# Patient Record
Sex: Male | Born: 1970
Health system: Southern US, Community
[De-identification: ages and names within clinical notes are randomized; demographics above are authoritative.]

## PROBLEM LIST (undated history)

## (undated) DIAGNOSIS — E785 Hyperlipidemia, unspecified: Secondary | ICD-10-CM

## (undated) DIAGNOSIS — I10 Essential (primary) hypertension: Secondary | ICD-10-CM

## (undated) HISTORY — DX: Hyperlipidemia, unspecified: E78.5

## (undated) HISTORY — DX: Essential (primary) hypertension: I10

---

## 2006-11-16 ENCOUNTER — Emergency Department: Payer: Self-pay | Admitting: Emergency Medicine

## 2006-11-29 ENCOUNTER — Ambulatory Visit: Payer: Self-pay

## 2007-02-09 ENCOUNTER — Ambulatory Visit: Payer: Self-pay | Admitting: Pain Medicine

## 2007-02-19 ENCOUNTER — Ambulatory Visit: Payer: Self-pay | Admitting: Pain Medicine

## 2007-02-21 ENCOUNTER — Ambulatory Visit: Payer: Self-pay | Admitting: Pain Medicine

## 2007-04-15 ENCOUNTER — Ambulatory Visit: Payer: Self-pay | Admitting: Pain Medicine

## 2007-05-07 ENCOUNTER — Ambulatory Visit: Payer: Self-pay | Admitting: Pain Medicine

## 2007-06-05 ENCOUNTER — Ambulatory Visit: Payer: Self-pay | Admitting: Physician Assistant

## 2007-06-25 ENCOUNTER — Ambulatory Visit: Payer: Self-pay | Admitting: Pain Medicine

## 2007-07-05 ENCOUNTER — Ambulatory Visit: Payer: Self-pay | Admitting: Emergency Medicine

## 2007-07-17 ENCOUNTER — Ambulatory Visit: Payer: Self-pay | Admitting: Physician Assistant

## 2007-07-28 ENCOUNTER — Encounter: Payer: Self-pay | Admitting: Emergency Medicine

## 2007-08-01 ENCOUNTER — Encounter: Payer: Self-pay | Admitting: Emergency Medicine

## 2007-09-01 ENCOUNTER — Ambulatory Visit: Payer: Self-pay | Admitting: Physician Assistant

## 2007-10-30 ENCOUNTER — Ambulatory Visit: Payer: Self-pay | Admitting: Physician Assistant

## 2007-12-13 ENCOUNTER — Ambulatory Visit: Payer: Self-pay | Admitting: Family Medicine

## 2007-12-16 ENCOUNTER — Ambulatory Visit: Payer: Self-pay | Admitting: Internal Medicine

## 2008-01-14 ENCOUNTER — Ambulatory Visit: Payer: Self-pay | Admitting: Physician Assistant

## 2008-04-20 ENCOUNTER — Ambulatory Visit: Payer: Self-pay | Admitting: Physician Assistant

## 2008-07-14 ENCOUNTER — Ambulatory Visit: Payer: Self-pay | Admitting: Internal Medicine

## 2008-07-21 ENCOUNTER — Ambulatory Visit: Payer: Self-pay | Admitting: Physician Assistant

## 2008-10-20 ENCOUNTER — Ambulatory Visit: Payer: Self-pay | Admitting: Physician Assistant

## 2009-02-02 ENCOUNTER — Ambulatory Visit: Payer: Self-pay | Admitting: Physician Assistant

## 2009-12-21 ENCOUNTER — Emergency Department: Payer: Self-pay | Admitting: Emergency Medicine

## 2009-12-23 ENCOUNTER — Emergency Department: Payer: Self-pay | Admitting: Emergency Medicine

## 2009-12-29 HISTORY — PX: OTHER SURGICAL HISTORY: SHX169

## 2010-02-13 ENCOUNTER — Encounter: Payer: Self-pay | Admitting: Nurse Practitioner

## 2010-02-28 ENCOUNTER — Encounter: Payer: Self-pay | Admitting: Nurse Practitioner

## 2010-10-14 ENCOUNTER — Ambulatory Visit: Payer: Self-pay | Admitting: Internal Medicine

## 2010-10-15 ENCOUNTER — Ambulatory Visit: Payer: Self-pay | Admitting: Internal Medicine

## 2016-06-10 ENCOUNTER — Other Ambulatory Visit: Payer: Self-pay | Admitting: Nurse Practitioner

## 2016-06-10 DIAGNOSIS — M542 Cervicalgia: Secondary | ICD-10-CM

## 2016-06-19 ENCOUNTER — Ambulatory Visit: Payer: Self-pay

## 2016-06-19 ENCOUNTER — Other Ambulatory Visit: Payer: Self-pay

## 2016-07-11 ENCOUNTER — Ambulatory Visit: Payer: Self-pay

## 2016-07-11 ENCOUNTER — Other Ambulatory Visit: Payer: Self-pay

## 2016-07-11 LAB — PSA: PSA: 3

## 2016-07-11 LAB — HEMOGLOBIN A1C: Hemoglobin A1C: 5.3

## 2016-07-19 ENCOUNTER — Ambulatory Visit: Admission: RE | Admit: 2016-07-19 | Payer: 59 | Source: Ambulatory Visit

## 2016-07-19 ENCOUNTER — Ambulatory Visit: Payer: 59

## 2016-08-02 ENCOUNTER — Ambulatory Visit
Admission: RE | Admit: 2016-08-02 | Discharge: 2016-08-02 | Disposition: A | Discharge status: Home / Self Care | Payer: 59 | Source: Ambulatory Visit | Attending: Nurse Practitioner | Admitting: Nurse Practitioner

## 2016-08-02 DIAGNOSIS — M50223 Other cervical disc displacement at C6-C7 level: Secondary | ICD-10-CM | POA: Insufficient documentation

## 2016-08-02 DIAGNOSIS — M4802 Spinal stenosis, cervical region: Secondary | ICD-10-CM | POA: Diagnosis not present

## 2016-08-02 DIAGNOSIS — M50323 Other cervical disc degeneration at C6-C7 level: Secondary | ICD-10-CM | POA: Diagnosis not present

## 2016-08-02 DIAGNOSIS — M542 Cervicalgia: Secondary | ICD-10-CM

## 2016-08-02 DIAGNOSIS — M5126 Other intervertebral disc displacement, lumbar region: Secondary | ICD-10-CM | POA: Diagnosis not present

## 2016-08-02 DIAGNOSIS — M48061 Spinal stenosis, lumbar region without neurogenic claudication: Secondary | ICD-10-CM | POA: Diagnosis not present

## 2017-06-11 LAB — BASIC METABOLIC PANEL
BUN: 13 (ref 4–21)
Creatinine: 0.9 (ref 0.6–1.3)
Glucose: 97
Potassium: 5.1 (ref 3.4–5.3)
SODIUM: 141 (ref 137–147)

## 2017-06-11 LAB — CBC AND DIFFERENTIAL
HEMATOCRIT: 43 (ref 41–53)
Hemoglobin: 14.4 (ref 13.5–17.5)
NEUTROS ABS: 3
PLATELETS: 230 (ref 150–399)
WBC: 6

## 2017-06-11 LAB — LIPID PANEL
Cholesterol: 241 — AB (ref 0–200)
HDL: 41 (ref 35–70)
LDL CALC: 164
TRIGLYCERIDES: 180 — AB (ref 40–160)

## 2017-06-11 LAB — HEPATIC FUNCTION PANEL
ALK PHOS: 74 (ref 25–125)
ALT: 30 (ref 10–40)
AST: 24 (ref 14–40)
BILIRUBIN, TOTAL: 0.3

## 2017-06-11 LAB — TESTOSTERONE: Testosterone: 171

## 2017-12-29 ENCOUNTER — Other Ambulatory Visit: Payer: Self-pay | Admitting: Family Medicine

## 2017-12-29 ENCOUNTER — Encounter: Payer: Self-pay | Admitting: Family Medicine

## 2017-12-29 ENCOUNTER — Ambulatory Visit (INDEPENDENT_AMBULATORY_CARE_PROVIDER_SITE_OTHER): Payer: Self-pay | Admitting: Family Medicine

## 2017-12-29 VITALS — BP 136/85 | HR 61 | Temp 97.9°F | Resp 16 | Ht 76.0 in | Wt 251.4 lb

## 2017-12-29 DIAGNOSIS — K219 Gastro-esophageal reflux disease without esophagitis: Secondary | ICD-10-CM | POA: Insufficient documentation

## 2017-12-29 DIAGNOSIS — I10 Essential (primary) hypertension: Secondary | ICD-10-CM | POA: Insufficient documentation

## 2017-12-29 DIAGNOSIS — M5136 Other intervertebral disc degeneration, lumbar region: Secondary | ICD-10-CM | POA: Insufficient documentation

## 2017-12-29 DIAGNOSIS — M5441 Lumbago with sciatica, right side: Secondary | ICD-10-CM

## 2017-12-29 DIAGNOSIS — E785 Hyperlipidemia, unspecified: Secondary | ICD-10-CM

## 2017-12-29 DIAGNOSIS — G8929 Other chronic pain: Secondary | ICD-10-CM

## 2017-12-29 DIAGNOSIS — Z7689 Persons encountering health services in other specified circumstances: Secondary | ICD-10-CM

## 2017-12-29 DIAGNOSIS — Z0000 Encounter for general adult medical examination without abnormal findings: Secondary | ICD-10-CM

## 2017-12-29 NOTE — Assessment & Plan Note (Signed)
Uncertain control, reportedly good control on statin Last lipid panel >6 months ago  Plan: 1. Continue current meds - Rosuvastatin 10mg  daily 2. Encourage improved lifestyle - low carb/cholesterol, reduce portion size, consider regular exercise - Due for fasting lipids 6 wk to 3 months then yearly

## 2017-12-29 NOTE — Assessment & Plan Note (Signed)
Stable w/o flare chronic R> LBP with associated episodic R sciatica. In setting of known chronic LBP with DJD, without prior surgery. [facility], limited result on ESI Prior MRI on chart 2008 and 07/2016 Improve on Gabapentin, limited results  Plan: 1. Reviewed prior diagnosis, MRI results, and treatment options - May continue current course with intermittent NSAID, advised caution need repeat lab to monitor Cr, may risk worse GERD - Recommend inc max dose regular Tylenol up to 1000mg  TID most days vs PRN - May try alternative muscle relaxant in future w/ Baclofen if interested - Encouraged use of heating pad 1-2x daily for now then PRN Follow-up as needed - advised next step should contact [facility] for re-evaluation may need further formal PT, other med, injections or discuss surgical options  In future advised if he prefers med management, we can consider alternative meds such as Cymbalta

## 2017-12-29 NOTE — Assessment & Plan Note (Signed)
Underlying etiology for chronic LBP See A&P

## 2017-12-29 NOTE — Progress Notes (Signed)
Subjective:    Patient ID: Steven Horn, male    DOB: [birth year], 47 y.o.   MRN: [id number]  Steven Horn is a 47 y.o. male presenting on 12/29/2017 for Establish Care (B/P); Hypertension; and Hyperlipidemia  Previously followed by PCP in [place] for >15 years, now with recent insurance change here to establish new PCP locally.  HPI   CHRONIC HTN: Reports no concerns with BP currently. He does not check BP outside office. He has been dx with HTN for >2-3 years. Current Meds - Lisinopril 10mg  daily   Reports good compliance, took meds today. Tolerating well, w/o complaints.  HYPERLIPIDEMIA: - Reports no concerns currently but he does have early family history of MI / CAD on maternal side. Last lipid panel 6 to 12 months ago, previously controlled on Statin - Currently taking Rosuvastatin 10mg  daily, tolerating well without side effects or myalgias Lifestyle - Diet: Improved diet, smaller portions, eating better now that kids out of house, drinks occasional soft drink coke zero or sprite zero, drinks 2-3 12 to 16 oz bottle water, some sweet tea 1-2 x weekly out to eat - Exercise: active with work physical job, no regular exercise - Maternal family history uncle passed age 49 MI  He reports additionally he has had prior blood work that showed normal blood sugar and kidney function.  Chronic Low Back Pain, DDD Lumbar Spine / History of OA/DJD in other joints (hands R > L) Reports no prior actual back injury, >15+ years of this issue, seems to be gradual  Prior - Previously had [facility], and then eventually went to Ortho / Pain for repeat MRI, and he has received ESI in past with limited. - Reports worsening >1 month with R low back pain and radiating pain down R lower leg with paresthesias. - Taking Gabapentin 600mg  2 to 3 times daily, if takes 3 regularly will get slightly dizzy lightheaded - Taking Ibuprofen 800mg  BID and Tylenol 325mg  BID, few weeks at a time for flares    GERD Chronic issue with heartburn acid reflux, never seen GI. Taking OTC Nexium 20mg  daily, tolerating well. Additionally with history of episode of IBS flare up with cramping abdominal bloating and some diarrhea intermittent  Additional history: - Works for [employer] for >20 years, frequently in past with heavy lifting, truck  Health Maintenance:  No known family history of prostate or colon cancer.  He declines routine HIV screening due to low risk.  He is due for TDap does not recall last.  [facility] 12/29/2017  Decreased Interest 0  Down, Depressed, Hopeless 0  PHQ - 2 Score 0    Past Medical History:  Diagnosis Date  . Hyperlipidemia   . Hypertension    Past Surgical History:  Procedure Laterality Date  . 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 Marital status: Married    Spouse name: Not on file  . Number of children: Not on file  . Years of education: [employer]  . Highest education level: High school graduate  Occupational History  . Occupation: [employer]  Social Needs  . Financial resource strain: Not on file  . Food insecurity:    Worry: Not on file    Inability: Not on file  . Transportation needs:    Medical: Not on file    Non-medical: Not on file  Tobacco Use  . Smoking status: Current Every Day Smoker    Packs/day: 1.00    Years: 32.00    Pack years: 32.00    Types: Cigarettes  . Smokeless tobacco: Current User  Substance and Sexual Activity  . Alcohol use: Yes    Alcohol/week: 1.2 oz    Types: 2 Standard drinks or equivalent per week    Frequency: Never  . Drug use: Never  . Sexual activity: Not on file  Lifestyle  . Physical activity:    Days per week: Not on file    Minutes per session: Not on file  . Stress: Not on file  Relationships  . Social connections:    Talks on phone: Not on file     Gets together: Not on file    Attends religious service: Not on file    Active member of club or organization: Not on file    Attends meetings of clubs or organizations: Not on file    Relationship status: Not on file  . Intimate partner violence:    Fear of current or ex partner: Not on file    Emotionally abused: Not on file    Physically abused: Not on file    Forced sexual activity: Not on file  Other Topics Concern  . Not on file  Social History Narrative  . Not on file   Family History  Problem Relation Age of Onset  . Diabetes Mother   . Depression Father   . Alcohol abuse Father    Current Outpatient Medications on File Prior to Visit  Medication Sig  . Esomeprazole Magnesium (NEXIUM PO) Take 20 mg by mouth daily.  Marland Kitchen gabapentin (NEURONTIN) 600 MG tablet TK 1 T PO IN THE MORNING AND 2 TS IN THE EVE UTD  . ibuprofen (ADVIL,MOTRIN) 800 MG tablet Take 800 mg by mouth 2 (two) times daily.  Marland Kitchen lisinopril (PRINIVIL,ZESTRIL) 10 MG tablet Take 10 mg by mouth daily.  . rosuvastatin (CRESTOR) 10 MG tablet TK 1 T PO ONCE D   No current facility-administered medications on file prior to visit.     Review of Systems  Constitutional: Negative for activity change, appetite change, chills, diaphoresis, fatigue, fever and unexpected weight change.  HENT: Negative for congestion and hearing loss.   Eyes: Negative for visual disturbance.  Respiratory: Negative for cough, chest tightness, shortness of breath and wheezing.   Cardiovascular: Negative for chest pain, palpitations and leg swelling.  Gastrointestinal: Negative for abdominal pain, anal bleeding, blood in stool, constipation, diarrhea, nausea and vomiting.  Endocrine: Negative for cold intolerance.  Genitourinary: Negative for dysuria, frequency and hematuria.  Musculoskeletal: Negative for arthralgias and neck pain.  Skin: Negative for rash.  Allergic/Immunologic: Negative for environmental allergies.  Neurological: Negative  for dizziness, weakness, light-headedness, numbness and headaches.  Hematological: Negative for adenopathy.  Psychiatric/Behavioral: Negative for behavioral problems, dysphoric mood and sleep disturbance.   Per HPI unless specifically indicated above     Objective:    BP 136/85   Pulse 61   Temp 97.9 F (36.6 C) (Oral)   Resp 16   Ht 6\' 4"  (1.93 m)   Wt 251 lb 6.4 oz (114 kg)   BMI 30.60 kg/m   Wt Readings from Last 3 Encounters:  12/29/17 251 lb 6.4 oz (114 kg)    Physical Exam  Constitutional: He is oriented to person, place, and time. He appears well-developed and well-nourished. No distress.  Well-appearing, comfortable, cooperative  HENT:  Head: Normocephalic  and atraumatic.  Mouth/Throat: Oropharynx is clear and moist.  Eyes: Conjunctivae are normal. Right eye exhibits no discharge. Left eye exhibits no discharge.  Cardiovascular: Normal rate.  Pulmonary/Chest: Effort normal.  Musculoskeletal: He exhibits no edema.  L 4th digit s/p distal DIP and nailbed amputation, well healed  Neurological: He is alert and oriented to person, place, and time.  Skin: Skin is warm and dry. No rash noted. He is not diaphoretic. No erythema.  Psychiatric: He has a normal mood and affect. His behavior is normal.  Well groomed, good eye contact, normal speech and thoughts  Nursing note and vitals reviewed.   I have personally reviewed the radiology report from Lumbar Spine MRI on 08/02/16.   CLINICAL DATA:  Back pain with bilateral leg pain. No prior back surgery.  EXAM: MRI LUMBAR SPINE WITHOUT CONTRAST  TECHNIQUE: Multiplanar, multisequence MR imaging of the lumbar spine was performed. No intravenous contrast was administered.  COMPARISON:  Lumbar MRI 11/29/2006  FINDINGS: Segmentation:  Normal  Alignment: Mild retrolisthesis L5-S1 similar to the prior study. Remaining alignment normal. Straightening of the lumbar lordosis.  Vertebrae:  Discogenic changes at L5-S1.   No fracture or mass.  Conus medullaris: Extends to the mid L1 level and appears normal.  Paraspinal and other soft tissues: Paraspinous muscles normal. No retroperitoneal mass or adenopathy.  Disc levels:  L1-2:  Mild disc bulging without stenosis  L2-3: Disc bulging and mild spurring. Bilateral facet hypertrophy. Degenerative changes have progressed. Moderate spinal stenosis has progressed significantly in the interval. Mild subarticular stenosis bilaterally  L3-4: Moderate disc degeneration with disc bulging and endplate spurring. Central disc protrusion has progressed. Moderate spinal stenosis has progressed since the prior study.  L4-5: Central small disc protrusion similar to the prior study. Mild spinal stenosis similar to the prior study. Foramina widely patent  L5-S1: Disc degeneration with diffuse endplate spurring similar to the prior study. Left-sided epidural disc and hematoma has resolved since the prior study. No recurrent disc protrusion. Mild foraminal narrowing bilaterally due to spurring.  IMPRESSION: Moderate spinal stenosis at L2-3 has progressed since 2008  Moderate spinal stenosis at L3-4 has progressed since 2008  Small central disc protrusion L4-5 with mild spinal stenosis at L4-5 has not changed.  Left-sided disc protrusion and hematoma has resolved since the prior MRI. There remains diffuse endplate spurring and mild foraminal narrowing bilaterally.   Electronically Signed   By: Marlan Palau M.D.   On: 08/02/2016 15:42  No results found for this or any previous visit.    Assessment & Plan:   Problem List Items Addressed This Visit    Chronic bilateral low back pain with right-sided sciatica    Stable w/o flare chronic R> LBP with associated episodic R sciatica. In setting of known chronic LBP with DJD, without prior surgery. Previously followed by [facility] for injections, limited result on ESI Prior MRI on  chart 2008 and 07/2016 Improve on Gabapentin, limited results  Plan: 1. Reviewed prior diagnosis, MRI results, and treatment options - May continue current course with intermittent NSAID, advised caution need repeat lab to monitor Cr, may risk worse GERD - Recommend inc max dose regular Tylenol up to 1000mg  TID most days vs PRN - May try alternative muscle relaxant in future w/ Baclofen if interested - Encouraged use of heating pad 1-2x daily for now then PRN Follow-up as needed - advised next step should contact [facility] for re-evaluation may need further formal PT, other  med, injections or discuss surgical options  In future advised if he prefers med management, we can consider alternative meds such as Cymbalta      Relevant Medications   gabapentin (NEURONTIN) 600 MG tablet   ibuprofen (ADVIL,MOTRIN) 800 MG tablet   DDD (degenerative disc disease), lumbar    Underlying etiology for chronic LBP See A&P      Relevant Medications   ibuprofen (ADVIL,MOTRIN) 800 MG tablet   Essential hypertension - Primary    Controlled HTN - Home BP readings none available  No known complications  - due for labs   Plan:  1. Continue current BP regimen Lisinopril 10mg  daily 2. Encourage improved lifestyle - low sodium diet 3. May start monitor BP outside office, bring readings to next visit, if persistently >140/90 or new symptoms notify office sooner 4. Follow-up 6 wk to 3 month annual, labs, refill when rdy      Relevant Medications   rosuvastatin (CRESTOR) 10 MG tablet   lisinopril (PRINIVIL,ZESTRIL) 10 MG tablet   Gastroesophageal reflux disease    Stable GERD on PPI OTC daily WIthout GI red flag symptoms Recommend trial of OTC antacid vs rx carafate PRN with NSAID Continue OTC Nexium 20mg  daily Future may need GI if refractory      Relevant Medications   Esomeprazole Magnesium (NEXIUM PO)   Hyperlipidemia    Uncertain control, reportedly good control on statin Last lipid  panel >6 months ago  Plan: 1. Continue current meds - Rosuvastatin 10mg  daily 2. Encourage improved lifestyle - low carb/cholesterol, reduce portion size, consider regular exercise - Due for fasting lipids 6 wk to 3 months then yearly      Relevant Medications   rosuvastatin (CRESTOR) 10 MG tablet   lisinopril (PRINIVIL,ZESTRIL) 10 MG tablet    Other Visit Diagnoses    Encounter to establish care with new doctor     Request records from prior PCP in [place], review outside records [facility]      No orders of the defined types were placed in this encounter.   Follow up plan: Return in about 3 months (around 03/30/2018) for Annual Physical.   Future order labs placed for 3 months, anticipate may decide to return sooner within 6 weeks.  Saralyn Pilar, DO [facility] 12/29/2017, 11:38 PM

## 2017-12-29 NOTE — Patient Instructions (Addendum)
Thank you for coming to the office today.  When low on medicines, call our office or send mychart message to request WHICH meds and HOW MUCH (30 day) and give us about a 1 week notice before you run out.  If back is worsening or not improved, may adjust meds slightly see below  Recommend to start taking Tylenol Extra Strength 500mg  tabs - take 1 to 2 tabs per dose (max 1000mg ) every 6-8 hours for pain (take regularly, don't skip a dose for next 7 days), max 24 hour daily dose is 6 tablets or 3000mg . In the future you can repeat the same everyday Tylenol course for 1-2 weeks at a time.  - This is safe to take with anti-inflammatory medicines (Ibuprofen, Advil, Naproxen, Aleve, Meloxicam, Mobic)  May increase Gabapentin to 2 times daily  Also we can consider a different muscle relaxant such as Baclofen if interested  [facility]   [facility]  [facility] [address] [place], [place]  [address] Phone: [phone]  Also here is info for Dr Jenne Panehasnis  Chasnis, Berneda RoseBenjamin Charles, DO  [address] [place], [place] [address] [phone]  DUE for FASTING BLOOD WORK (no food or drink after midnight before the lab appointment, only water or coffee without cream/sugar on the morning of)  SCHEDULE "Lab Only" visit in the morning at the clinic for lab draw in  Within 3 MONTHS   - Make sure Lab Only appointment is at about 1 week before your next appointment, so that results will be available  For Lab Results, once available within 2-3 days of blood draw, you can can log in to MyChart online to view your results and a brief explanation. Also, we can discuss results at next follow-up visit.   Please schedule a Follow-up Appointment to: Return in about 3 months (around 03/30/2018) for Annual Physical.  If you have any other questions or concerns, please feel free to call the office or send a message through MyChart. You may also schedule an earlier  appointment if necessary.  Additionally, you may be receiving a survey about your experience at our office within a few days to 1 week by e-mail or mail. We value your feedback.  Saralyn PilarAlexander Dhilan Brauer, DO [facility]

## 2017-12-29 NOTE — Assessment & Plan Note (Signed)
Stable GERD on PPI OTC daily WIthout GI red flag symptoms Recommend trial of OTC antacid vs rx carafate PRN with NSAID Continue OTC Nexium 20mg  daily Future may need GI if refractory

## 2017-12-29 NOTE — Assessment & Plan Note (Signed)
Controlled HTN - Home BP readings none available  No known complications  - due for labs   Plan:  1. Continue current BP regimen Lisinopril 10mg  daily 2. Encourage improved lifestyle - low sodium diet 3. May start monitor BP outside office, bring readings to next visit, if persistently >140/90 or new symptoms notify office sooner 4. Follow-up 6 wk to 3 month annual, labs, refill when rdy

## 2018-01-04 ENCOUNTER — Encounter: Payer: Self-pay | Admitting: Family Medicine

## 2018-01-19 ENCOUNTER — Encounter: Payer: Self-pay | Admitting: Family Medicine

## 2018-01-19 DIAGNOSIS — I10 Essential (primary) hypertension: Secondary | ICD-10-CM

## 2018-01-20 ENCOUNTER — Other Ambulatory Visit: Payer: Self-pay

## 2018-01-20 DIAGNOSIS — G8929 Other chronic pain: Secondary | ICD-10-CM

## 2018-01-20 DIAGNOSIS — M5441 Lumbago with sciatica, right side: Principal | ICD-10-CM

## 2018-01-20 DIAGNOSIS — E785 Hyperlipidemia, unspecified: Secondary | ICD-10-CM

## 2018-01-20 DIAGNOSIS — M5136 Other intervertebral disc degeneration, lumbar region: Secondary | ICD-10-CM

## 2018-01-20 MED ORDER — LISINOPRIL 10 MG PO TABS: 10.0000 mg | ORAL_TABLET | Freq: Every day | ORAL | 11 refills | Status: DC

## 2018-01-20 MED ORDER — ROSUVASTATIN CALCIUM 10 MG PO TABS: 10.0000 mg | ORAL_TABLET | Freq: Every day | ORAL | 11 refills | Status: DC

## 2018-01-20 MED ORDER — GABAPENTIN 600 MG PO TABS: 600.0000 mg | ORAL_TABLET | ORAL | 11 refills | Status: DC

## 2018-02-20 ENCOUNTER — Encounter: Payer: Self-pay | Admitting: Family Medicine

## 2018-05-15 IMAGING — MR MR LUMBAR SPINE W/O CM
4 of 5 series · 24 of 48 positions shown · non-contrast
Comparison: Lumbar MRI 11/29/2006

CLINICAL DATA: Back pain with bilateral leg pain. No prior back
surgery.

EXAM:
MRI LUMBAR SPINE WITHOUT CONTRAST
TECHNIQUE: Multiplanar, multisequence MR imaging of the lumbar spine was
performed. No intravenous contrast was administered.

[Series 2: T2 · sagittal · 4.0mm · 0.81mm/px · 6 of 15 slices shown (1 of 2)]
[im 1/15]
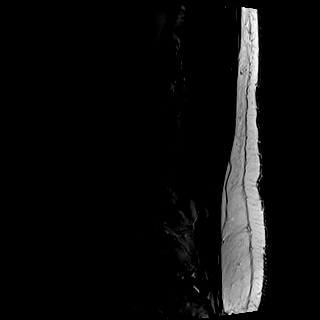
[im 3/15]
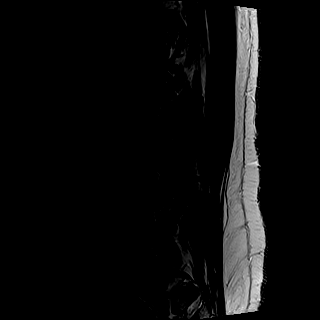
[im 6/15]
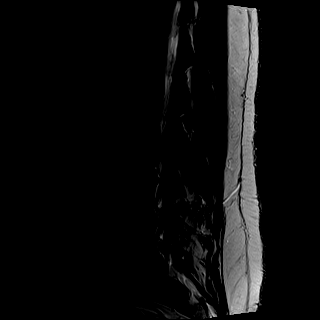
[im 9/15]
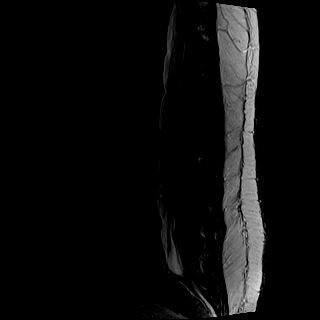
[im 12/15]
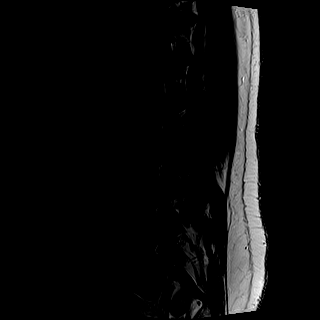
[im 15/15]
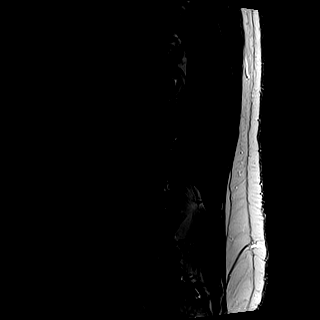

[Series 3: T1 · sagittal · 4.0mm · 0.41mm/px · 6 of 15 slices shown (1 of 2)]
[im 1/15]
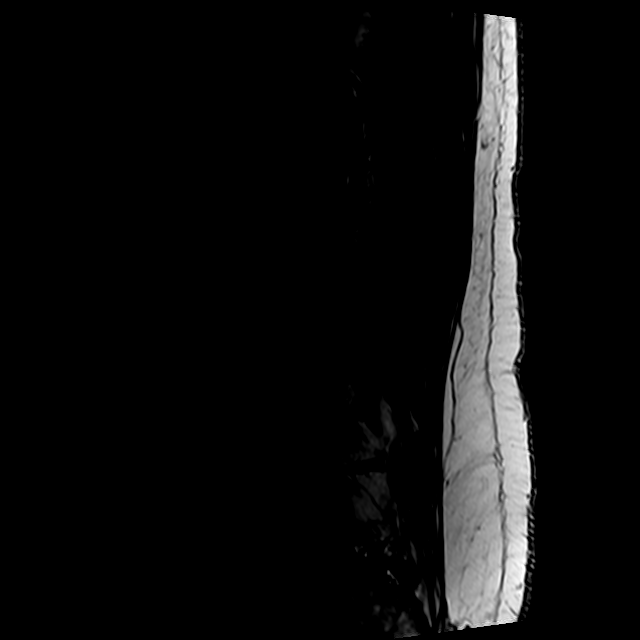
[im 3/15]
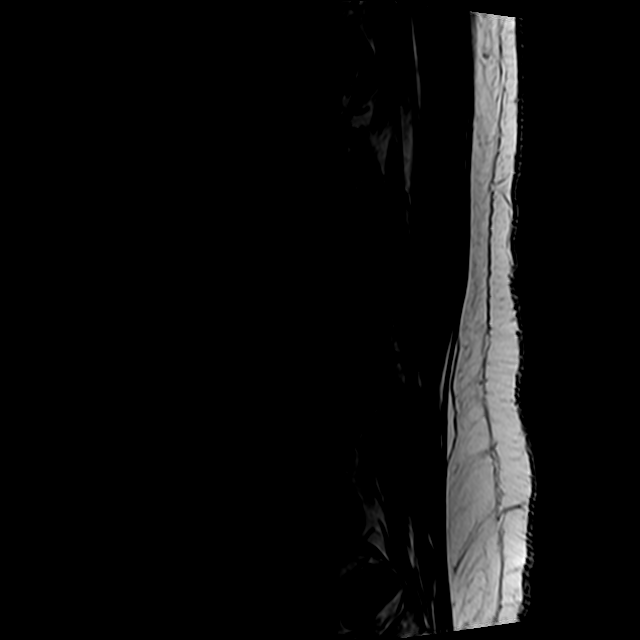
[im 6/15]
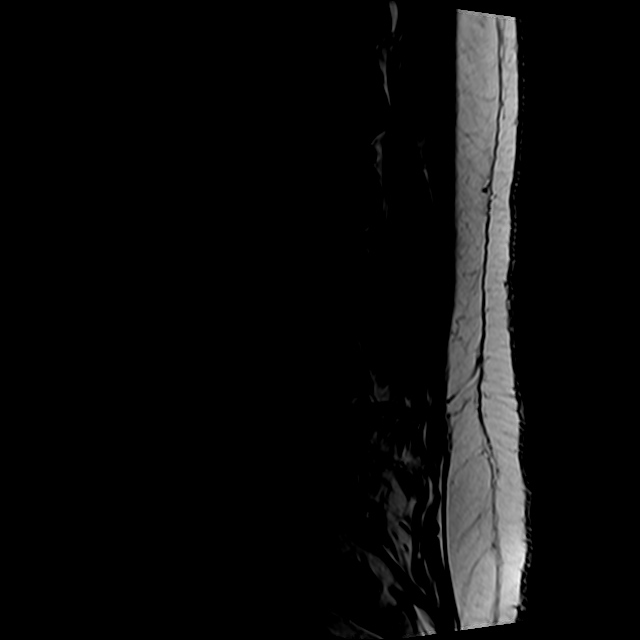
[im 9/15]
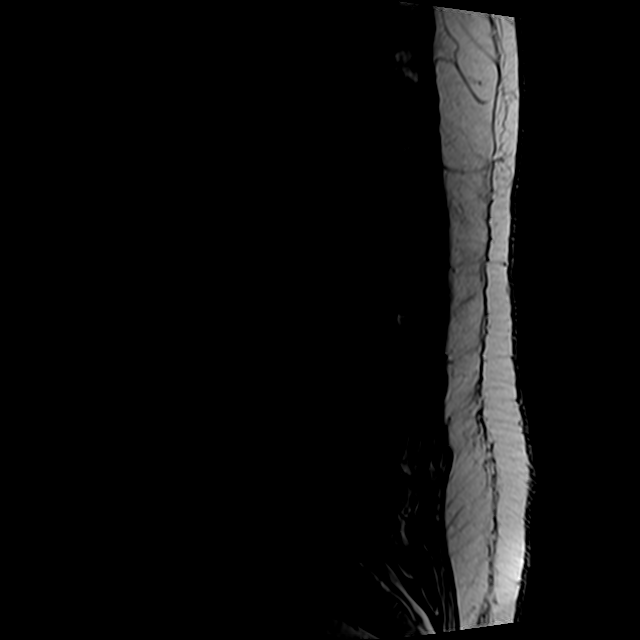
[im 12/15]
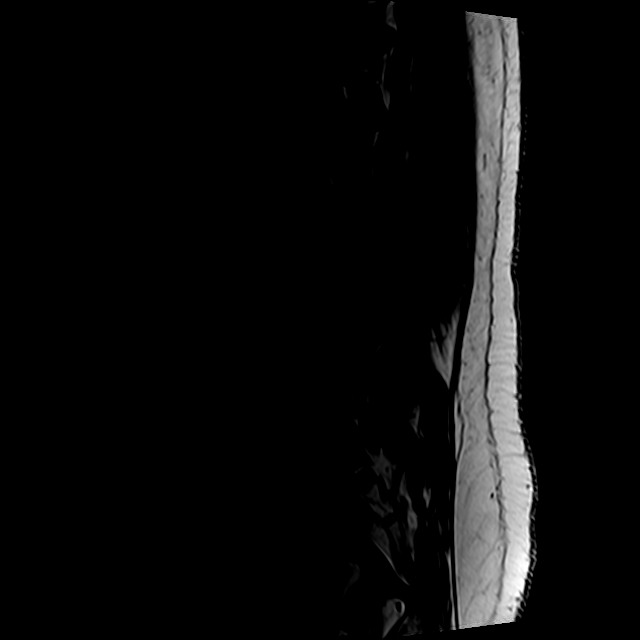
[im 15/15]
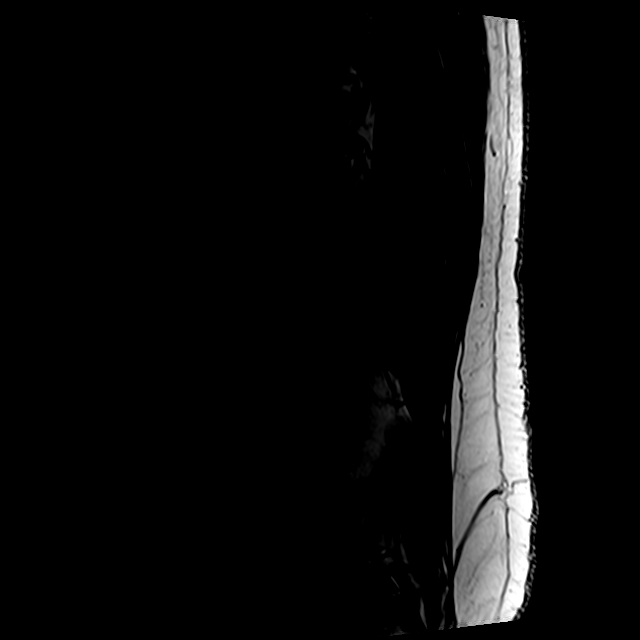

[Series 5: T2 · axial · 4.0mm · 0.78mm/px · z∈[-57,+156]mm · 9 of 40 slices shown (2 of 2)]
[im 1/40]
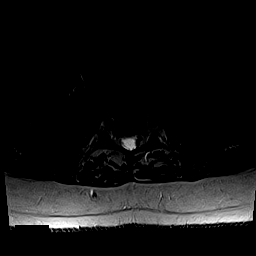
[im 6/40]
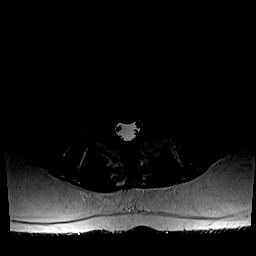
[im 12/40]
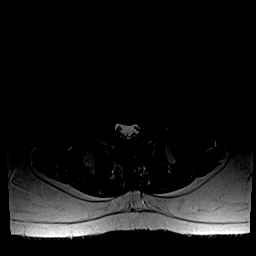
[im 17/40]
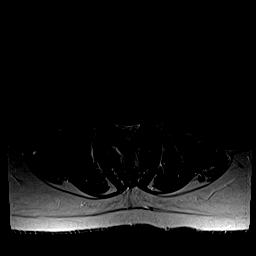
[im 20/40]
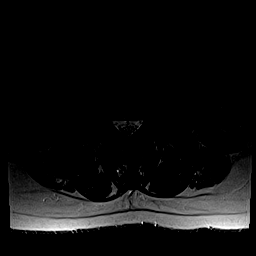
[im 23/40]
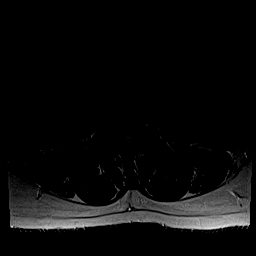
[im 28/40]
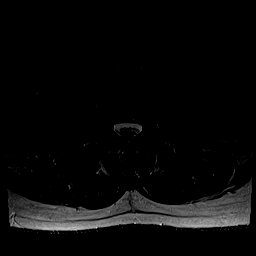
[im 34/40]
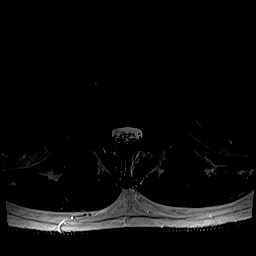
[im 40/40]
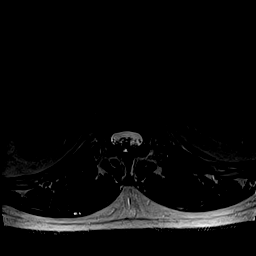

[Series 6: T1 · axial · 4.0mm · 0.31mm/px · z∈[-33,+126]mm · 3 of 40 slices shown (2 of 2)]
[im 6/40]
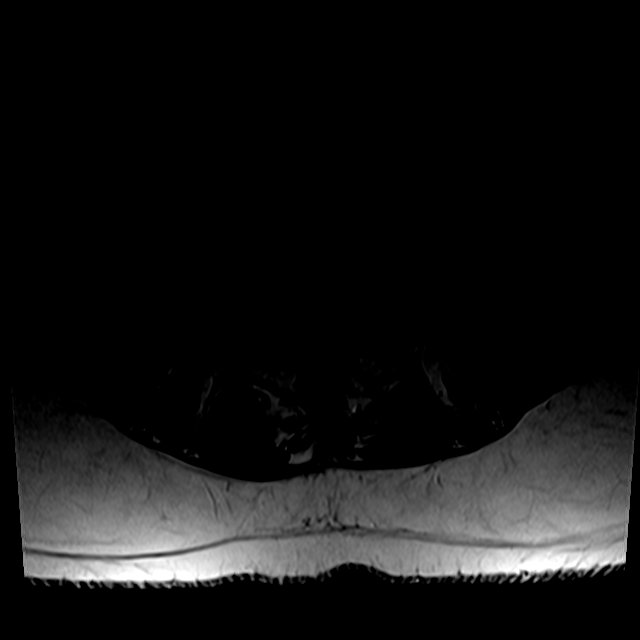
[im 20/40]
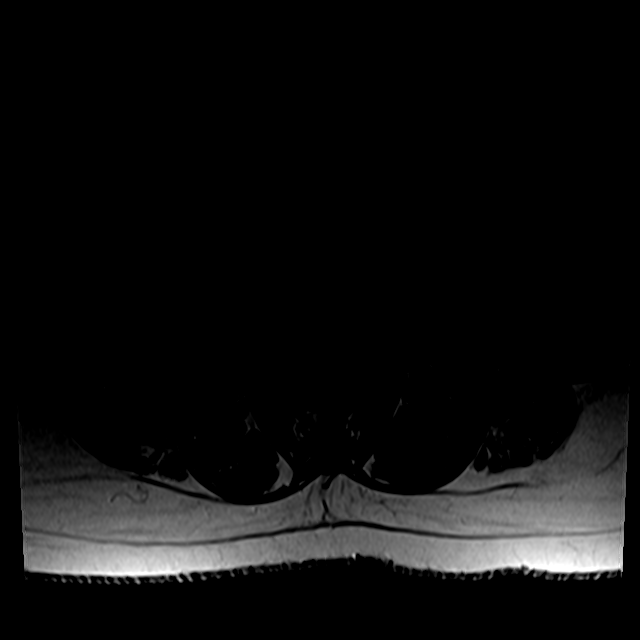
[im 34/40]
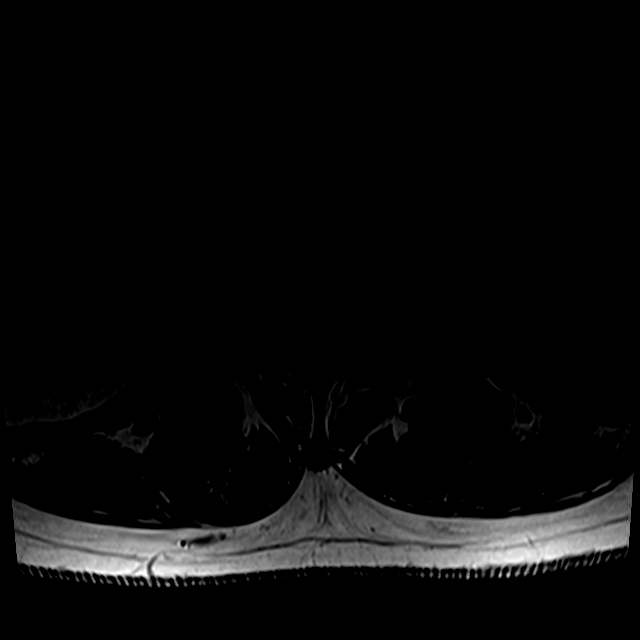

[24 of 48 positions shown; findings below may reference images not displayed]

FINDINGS: Segmentation:  Normal

Alignment: Mild retrolisthesis L5-S1 similar to the prior study.
Remaining alignment normal. Straightening of the lumbar lordosis.

Vertebrae:  Discogenic changes at L5-S1.  No fracture or mass.

Conus medullaris: Extends to the mid L1 level and appears normal.

Paraspinal and other soft tissues: Paraspinous muscles normal. No
retroperitoneal mass or adenopathy.

Disc levels:

L1-2:  Mild disc bulging without stenosis

L2-3: Disc bulging and mild spurring. Bilateral facet hypertrophy.
Degenerative changes have progressed. Moderate spinal stenosis has
progressed significantly in the interval. Mild subarticular stenosis
bilaterally

L3-4: Moderate disc degeneration with disc bulging and endplate
spurring. Central disc protrusion has progressed. Moderate spinal
stenosis has progressed since the prior study.

L4-5: Central small disc protrusion similar to the prior study. Mild
spinal stenosis similar to the prior study. Foramina widely patent

L5-S1: Disc degeneration with diffuse endplate spurring similar to
the prior study. Left-sided epidural disc and hematoma has resolved
since the prior study. No recurrent disc protrusion. Mild foraminal
narrowing bilaterally due to spurring.
IMPRESSION: Moderate spinal stenosis at L2-3 has progressed since 9778

Moderate spinal stenosis at L3-4 has progressed since 9778

Small central disc protrusion L4-5 with mild spinal stenosis at L4-5
has not changed.

Left-sided disc protrusion and hematoma has resolved since the prior
MRI. There remains diffuse endplate spurring and mild foraminal
narrowing bilaterally.

## 2019-01-22 ENCOUNTER — Ambulatory Visit (INDEPENDENT_AMBULATORY_CARE_PROVIDER_SITE_OTHER): Payer: Self-pay | Admitting: Family Medicine

## 2019-01-22 ENCOUNTER — Other Ambulatory Visit: Payer: Self-pay | Admitting: Family Medicine

## 2019-01-22 ENCOUNTER — Encounter: Payer: Self-pay | Admitting: Family Medicine

## 2019-01-22 ENCOUNTER — Other Ambulatory Visit: Payer: Self-pay

## 2019-01-22 VITALS — BP 124/83

## 2019-01-22 DIAGNOSIS — Z0000 Encounter for general adult medical examination without abnormal findings: Secondary | ICD-10-CM

## 2019-01-22 DIAGNOSIS — Z125 Encounter for screening for malignant neoplasm of prostate: Secondary | ICD-10-CM

## 2019-01-22 DIAGNOSIS — M5136 Other intervertebral disc degeneration, lumbar region: Secondary | ICD-10-CM

## 2019-01-22 DIAGNOSIS — I10 Essential (primary) hypertension: Secondary | ICD-10-CM

## 2019-01-22 DIAGNOSIS — G8929 Other chronic pain: Secondary | ICD-10-CM

## 2019-01-22 DIAGNOSIS — M5441 Lumbago with sciatica, right side: Secondary | ICD-10-CM

## 2019-01-22 DIAGNOSIS — M51369 Other intervertebral disc degeneration, lumbar region without mention of lumbar back pain or lower extremity pain: Secondary | ICD-10-CM

## 2019-01-22 DIAGNOSIS — E785 Hyperlipidemia, unspecified: Secondary | ICD-10-CM

## 2019-01-22 MED ORDER — LISINOPRIL 10 MG PO TABS: 10.0000 mg | ORAL_TABLET | Freq: Every day | ORAL | 11 refills | Status: DC

## 2019-01-22 MED ORDER — GABAPENTIN 600 MG PO TABS: 600.0000 mg | ORAL_TABLET | Freq: Two times a day (BID) | ORAL | 5 refills | Status: DC

## 2019-01-22 MED ORDER — ROSUVASTATIN CALCIUM 10 MG PO TABS: 10.0000 mg | ORAL_TABLET | Freq: Every day | ORAL | 11 refills | Status: DC

## 2019-01-22 NOTE — Progress Notes (Signed)
Virtual Visit via Telephone The purpose of this virtual visit is to provide medical care while limiting exposure to the novel coronavirus (COVID19) for both patient and office staff.  Consent was obtained for phone visit:  Yes.   Answered questions that patient had about telehealth interaction:  Yes.   I discussed the limitations, risks, security and privacy concerns of performing an evaluation and management service by telephone. I also discussed with the patient that there may be a patient responsible charge related to this service. The patient expressed understanding and agreed to proceed.  Patient Location: Home [facility]  ---------------------------------------------------------------------- Chief Complaint  Patient presents with  . Hypertension    need refills on medication  . Hyperlipidemia    S: Reviewed CMA documentation. I have called patient and gathered additional HPI as follows:  CHRONIC HTN: Reports no concerns with BP currently. Last BP checked today 124/83 Current Meds - Lisinopril 10mg  daily  - ran out for few days, needs refill Reports good compliance, took meds today. Tolerating well, w/o complaints. Denies CP, dyspnea, HA, edema, dizziness / lightheadedness  HYPERLIPIDEMIA: - Reports no concerns currently but he does have early family history of MI / CAD on maternal side - Currently taking Rosuvastatin 10mg  daily out for a few days, tolerating well without side effects or myalgias Lifestyle - Weight loss, down to 217 lbs - Diet: Improved diet, smaller portions and healthier options, not eating out currently, drinks water, some other low sugar drinks. - Exercise: active with work physical job, no regular exercise - Maternal family history uncle passed age 944 MI  Chronic Low Back Pain, DDD Lumbar Spine / History of OA/DJD in other joints (hands R > L) Reports no prior actual back injury, >15+ years of this issue,  seems to be gradual  - [facility], and then eventually went to Ortho / Pain for repeat MRI, and he has received ESI in past with limited. - Reports worsening >1 month with R low back pain and radiating pain down R lower leg with paresthesias. - Taking Gabapentin 600mg  usually 1 tab twice a day now he has reduced it, plans to taper down on it, can take a 3rd if needs - Taking Ibuprofen 800mg  BID and Tylenol 325mg  BID, few weeks at a time for flares   GERD Chronic issue with heartburn acid reflux, never seen GI. Taking OTC Nexium 20mg  daily, tolerating well. Additionally with history of episode of IBS flare up with cramping abdominal bloating and some diarrhea intermittent  Additional complaint Toes on R foot - Tingling, numbness at times, and some foot sores at times. He asks about future referral to Podiatry  Patient is currently working  Denies any high risk travel to areas of current concern for COVID19. Denies any known or suspected exposure to person with or possibly with COVID19.  Denies any fevers, chills, sweats, body ache, cough, shortness of breath, sinus pain or pressure, headache, abdominal pain, diarrhea  Past Medical History:  Diagnosis Date  . Hyperlipidemia   . Hypertension    Social History   Tobacco Use  . Smoking status: Current Every Day Smoker    Packs/day: 0.50    Years: 32.00    Pack years: 16.00    Types: Cigarettes  . Smokeless tobacco: Never Used  Substance Use Topics  . Alcohol use: Yes    Alcohol/week: 2.0 standard drinks    Types: 2 Standard drinks or equivalent per week  Frequency: Never  . Drug use: Never    Current Outpatient Medications:  .  B Complex-C-E-Zn (B COMPLEX-C-E-ZINC) tablet, Take 1 tablet by mouth daily., Disp: , Rfl:  .  Esomeprazole Magnesium (NEXIUM PO), Take 20 mg by mouth daily., Disp: , Rfl:  .  gabapentin (NEURONTIN) 600 MG tablet, Take 1 tablet (600 mg total) by mouth 2 (two) times daily.,  Disp: 60 tablet, Rfl: 5 .  ibuprofen (ADVIL,MOTRIN) 800 MG tablet, Take 800 mg by mouth 2 (two) times daily., Disp: , Rfl:  .  lisinopril (ZESTRIL) 10 MG tablet, Take 1 tablet (10 mg total) by mouth daily., Disp: 30 tablet, Rfl: 11 .  rosuvastatin (CRESTOR) 10 MG tablet, Take 1 tablet (10 mg total) by mouth daily., Disp: 30 tablet, Rfl: 11  Depression screen PHQ 2/9 12/29/2017  Decreased Interest 0  Down, Depressed, Hopeless 0  PHQ - 2 Score 0    No flowsheet data found.  -------------------------------------------------------------------------- O: No physical exam performed due to remote telephone encounter.  Lab results reviewed.  No results found for this or any previous visit (from the past 2160 hour(s)).  -------------------------------------------------------------------------- A&P:  Problem List Items Addressed This Visit    Chronic bilateral low back pain with right-sided sciatica    Stable w/o flare chronic R> LBP with associated episodic R sciatica. In setting of known chronic LBP with DJD, without prior surgery. Previously followed by [facility] / Pain for injections, limited result on ESI Prior MRI on chart 2008 and 07/2016 Improve on Gabapentin, limited results  Now tapering down on Gabapentin With some foot tingling, may be related to nerves Follow-up as needed - advised next step should contact [facility] / [facility] for re-evaluation may need further formal PT, other med, injections or discuss surgical options  In future advised if he prefers med management, we can consider alternative meds such as Cymbalta      Relevant Medications   gabapentin (NEURONTIN) 600 MG tablet   DDD (degenerative disc disease), lumbar   Relevant Medications   gabapentin (NEURONTIN) 600 MG tablet   Essential hypertension - Primary    Controlled HTN - Home BP readings none available  No known complications  - due for labs   Plan:  1. Continue current BP regimen Lisinopril 10mg   daily re order 2. Encourage improved lifestyle - low sodium diet 3. Continue to monitor BP outside office, bring readings to next visit, if persistently >140/90 or new symptoms notify office sooner      Relevant Medications   lisinopril (ZESTRIL) 10 MG tablet   rosuvastatin (CRESTOR) 10 MG tablet   Hyperlipidemia    Uncertain control, reportedly good control on statin Last lipid panel >1 year ago  Plan: 1. Continue current meds - Rosuvastatin 10mg  daily 2. Encourage improved lifestyle - low carb/cholesterol, reduce portion size, consider regular exercise  Due labs at next physical approx 4 mo      Relevant Medications   lisinopril (ZESTRIL) 10 MG tablet   rosuvastatin (CRESTOR) 10 MG tablet      Meds ordered this encounter  Medications  . lisinopril (ZESTRIL) 10 MG tablet    Sig: Take 1 tablet (10 mg total) by mouth daily.    Dispense:  30 tablet    Refill:  11  . rosuvastatin (CRESTOR) 10 MG tablet    Sig: Take 1 tablet (10 mg total) by mouth daily.    Dispense:  30 tablet    Refill:  11  . gabapentin (NEURONTIN) 600  MG tablet    Sig: Take 1 tablet (600 mg total) by mouth 2 (two) times daily.    Dispense:  60 tablet    Refill:  5    Follow-up: - Return in 4 months for Annual Physical - Future labs ordered for 05/05/19  Patient verbalizes understanding with the above medical recommendations including the limitation of remote medical advice.  Specific follow-up and call-back criteria were given for patient to follow-up or seek medical care more urgently if needed.   - Time spent in direct consultation with patient on phone: 12 minutes  Saralyn Pilar, DO [facility] 01/22/2019, 8:26 AM

## 2019-01-22 NOTE — Patient Instructions (Signed)
Medications refilled  Reduce gabapentin from 3 times a day, down to 2 times a day  DUE for FASTING BLOOD WORK (no food or drink after midnight before the lab appointment, only water or coffee without cream/sugar on the morning of)  SCHEDULE "Lab Only" visit in the morning at the clinic for lab draw in 4 MONTHS   - Make sure Lab Only appointment is at about 1 week before your next appointment, so that results will be available  For Lab Results, once available within 2-3 days of blood draw, you can can log in to MyChart online to view your results and a brief explanation. Also, we can discuss results at next follow-up visit.   Please schedule a Follow-up Appointment to: Return in about 4 months (around 05/24/2019) for Annual physical.  If you have any other questions or concerns, please feel free to call the office or send a message through MyChart. You may also schedule an earlier appointment if necessary.  Additionally, you may be receiving a survey about your experience at our office within a few days to 1 week by e-mail or mail. We value your feedback.  Saralyn Pilar, DO [facility], [place]

## 2019-01-23 NOTE — Assessment & Plan Note (Signed)
Uncertain control, reportedly good control on statin Last lipid panel >1 year ago  Plan: 1. Continue current meds - Rosuvastatin 10mg  daily 2. Encourage improved lifestyle - low carb/cholesterol, reduce portion size, consider regular exercise  Due labs at next physical approx 4 mo

## 2019-01-23 NOTE — Assessment & Plan Note (Signed)
Controlled HTN - Home BP readings none available  No known complications  - due for labs   Plan:  1. Continue current BP regimen Lisinopril 10mg  daily re order 2. Encourage improved lifestyle - low sodium diet 3. Continue to monitor BP outside office, bring readings to next visit, if persistently >140/90 or new symptoms notify office sooner

## 2019-01-23 NOTE — Assessment & Plan Note (Signed)
Stable w/o flare chronic R> LBP with associated episodic R sciatica. In setting of known chronic LBP with DJD, without prior surgery. Previously followed by [facility] / Pain for injections, limited result on ESI Prior MRI on chart 2008 and 07/2016 Improve on Gabapentin, limited results  Now tapering down on Gabapentin With some foot tingling, may be related to nerves Follow-up as needed - advised next step should contact [facility] / Pain for re-evaluation may need further formal PT, other med, injections or discuss surgical options  In future advised if he prefers med management, we can consider alternative meds such as Cymbalta

## 2019-02-17 ENCOUNTER — Encounter: Payer: Self-pay | Admitting: Family Medicine

## 2019-02-17 ENCOUNTER — Ambulatory Visit (INDEPENDENT_AMBULATORY_CARE_PROVIDER_SITE_OTHER): Payer: Self-pay | Admitting: Family Medicine

## 2019-02-17 ENCOUNTER — Other Ambulatory Visit: Payer: Self-pay

## 2019-02-17 VITALS — BP 143/88 | HR 64 | Temp 98.4°F | Resp 16 | Ht 76.0 in | Wt 227.4 lb

## 2019-02-17 DIAGNOSIS — K098 Other cysts of oral region, not elsewhere classified: Secondary | ICD-10-CM

## 2019-02-17 NOTE — Patient Instructions (Addendum)
Thank you for coming to the office today.  Most likely a cyst-like structure within the mouth, likely from debris and has sort of walled off and is swollen, but it does not seem to be infected. It is not consistent with a tumor or mass or growth. It does not appear to be on the tonsil or a tonsil stone.  It may still spontaneously resolve or drain - you can try warm salt water gargles if you want for home remedy  If not better or improving by 2-4 weeks, call us back or send mychart message and we can place referral, without further need of appointment.  [facility] [address]  [place], [place] [address] Ph: [phone]   Please schedule a Follow-up Appointment to: Return if symptoms worsen or fail to improve, for cyst in mouth.  If you have any other questions or concerns, please feel free to call the office or send a message through MyChart. You may also schedule an earlier appointment if necessary.  Additionally, you may be receiving a survey about your experience at our office within a few days to 1 week by e-mail or mail. We value your feedback.  Saralyn Pilar, DO [facility], [place]

## 2019-02-17 NOTE — Progress Notes (Signed)
Subjective:    Patient ID: Steven Horn, male    DOB: Nov 28, 1970, 48 y.o.   MRN: [id number]  Steven Horn is a 48 y.o. male presenting on 02/17/2019 for spot behind throat (white spot onset month but changing size--tonsillolith ?)   HPI   Oral Cyst Reports new problem onset 1 month with gradual worsening increased size of cyst like structure white raised spot on back R side of mouth towards throat but he says it is not on his tonsil. He has noticed it and observed it, and he contacted [place] on mychart recently asking about it, seems to be growing in size. Never had something similar. Not had tonsil stones before - Not using any treatment. Not tried salt water gargles. He normally uses listerine mouth wash - He is a smoker, see below. Drinks some alcohol. - Denies any throat pain or sore throat, fever chills sweats, cough, drainage in back of throat, other spots in mouth   Depression screen [facility] 2/9 02/17/2019 12/29/2017  Decreased Interest 0 0  Down, Depressed, Hopeless 0 0  PHQ - 2 Score 0 0    Social History   Tobacco Use  . Smoking status: Current Every Day Smoker    Packs/day: 0.50    Years: 32.00    Pack years: 16.00    Types: Cigarettes  . Smokeless tobacco: Never Used  Substance Use Topics  . Alcohol use: Yes    Alcohol/week: 2.0 standard drinks    Types: 2 Standard drinks or equivalent per week    Frequency: Never  . Drug use: Never    Review of Systems Per HPI unless specifically indicated above     Objective:    BP (!) 143/88   Pulse 64   Temp 98.4 F (36.9 C) (Oral)   Resp 16   Ht 6\' 4"  (1.93 m)   Wt 227 lb 6.4 oz (103.1 kg)   BMI 27.68 kg/m   Wt Readings from Last 3 Encounters:  02/17/19 227 lb 6.4 oz (103.1 kg)  12/29/17 251 lb 6.4 oz (114 kg)    Physical Exam Vitals signs and nursing note reviewed.  Constitutional:      General: He is not in acute distress.    Appearance: He is well-developed. He is not diaphoretic.     Comments: Well-appearing,  comfortable, cooperative  HENT:     Head: Normocephalic and atraumatic.     Mouth/Throat:     Mouth: Mucous membranes are moist.     Pharynx: No oropharyngeal exudate or posterior oropharyngeal erythema.     Comments: Right oropharynx pre-tonsillar area with 1 x 1 cm cystic raised structure appears white, without ulceration. No surrounding edema or erythema. Eyes:     General:        Right eye: No discharge.        Left eye: No discharge.     Conjunctiva/sclera: Conjunctivae normal.  Cardiovascular:     Rate and Rhythm: Normal rate.  Pulmonary:     Effort: Pulmonary effort is normal.  Skin:    General: Skin is warm and dry.     Findings: No erythema or rash.  Neurological:     Mental Status: He is alert and oriented to person, place, and time.  Psychiatric:        Behavior: Behavior normal.     Comments: Well groomed, good eye contact, normal speech and thoughts        Results for orders placed or performed in visit on  01/04/18  CBC and differential  Result Value Ref Range   Hemoglobin 14.4 13.5 - 17.5   HCT 43 41 - 53   Neutrophils Absolute 3    Platelets 230 150 - 399   WBC 6.0   Basic metabolic panel  Result Value Ref Range   Glucose 97    BUN 13 4 - 21   Creatinine 0.9 0.6 - 1.3   Potassium 5.1 3.4 - 5.3   Sodium 141 137 - 147  Lipid panel  Result Value Ref Range   Triglycerides 180 (A) 40 - 160   Cholesterol 241 (A) 0 - 200   HDL 41 35 - 70   LDL Cholesterol 164   Hepatic function panel  Result Value Ref Range   Alkaline Phosphatase 74 25 - 125   ALT 30 10 - 40   AST 24 14 - 40   Bilirubin, Total 0.3   Testosterone  Result Value Ref Range   Testosterone 171   Hemoglobin A1c  Result Value Ref Range   Hemoglobin A1C 5.3   PSA  Result Value Ref Range   PSA 3.0       Assessment & Plan:   Problem List Items Addressed This Visit    None    Visit Diagnoses    Mucous cyst of mouth    -  Primary      Clinically with localized cystic lesion  within R posterior oropharynx - appears to be prior to tonsillar tissue. Not consistent with a tonsillith it seems or stone but difficult to clearly determine based on my exam. - Does not appear to be a tumor or growth or other complication, no ulceration or other involvement - No sign of infection  Plan - reassurance given today - monitor for longer about 2-4 more weeks to see if self limited - Try warm water salt gargles, continue mouth wash - Offered referral to ENT if interested - we agree to defer for 2-4 weeks, call or mychart back if worsening or new complaint or just ready for consultation from specialist  No orders of the defined types were placed in this encounter.     Follow up plan: Return if symptoms worsen or fail to improve, for cyst in mouth.   Saralyn PilarAlexander Yanisa Goodgame, DO [facility] 02/17/2019, 3:07 PM

## 2019-04-21 ENCOUNTER — Other Ambulatory Visit: Payer: Self-pay | Admitting: Family Medicine

## 2019-04-21 DIAGNOSIS — G8929 Other chronic pain: Secondary | ICD-10-CM

## 2019-04-21 DIAGNOSIS — M5136 Other intervertebral disc degeneration, lumbar region: Secondary | ICD-10-CM

## 2019-05-05 ENCOUNTER — Other Ambulatory Visit: Payer: Self-pay

## 2019-05-05 DIAGNOSIS — Z125 Encounter for screening for malignant neoplasm of prostate: Secondary | ICD-10-CM

## 2019-05-05 DIAGNOSIS — I10 Essential (primary) hypertension: Secondary | ICD-10-CM

## 2019-05-05 DIAGNOSIS — E785 Hyperlipidemia, unspecified: Secondary | ICD-10-CM

## 2019-05-05 DIAGNOSIS — Z0000 Encounter for general adult medical examination without abnormal findings: Secondary | ICD-10-CM

## 2019-05-11 ENCOUNTER — Encounter: Payer: Self-pay | Admitting: Family Medicine

## 2019-05-19 ENCOUNTER — Ambulatory Visit
Admission: EM | Admit: 2019-05-19 | Discharge: 2019-05-19 | Disposition: A | Discharge status: Home / Self Care | Payer: Commercial Managed Care - PPO | Attending: Family Medicine | Admitting: Family Medicine

## 2019-05-19 ENCOUNTER — Other Ambulatory Visit: Payer: Self-pay

## 2019-05-19 DIAGNOSIS — L03115 Cellulitis of right lower limb: Secondary | ICD-10-CM

## 2019-05-19 DIAGNOSIS — R509 Fever, unspecified: Secondary | ICD-10-CM

## 2019-05-19 DIAGNOSIS — R110 Nausea: Secondary | ICD-10-CM

## 2019-05-19 DIAGNOSIS — Z7189 Other specified counseling: Secondary | ICD-10-CM

## 2019-05-19 MED ORDER — ONDANSETRON 8 MG PO TBDP
8.0000 mg | ORAL_TABLET | Freq: Once | ORAL | Status: AC
  Administered 2019-05-19: 8 mg via ORAL

## 2019-05-19 MED ORDER — DOXYCYCLINE HYCLATE 100 MG PO CAPS: 100.0000 mg | ORAL_CAPSULE | Freq: Two times a day (BID) | ORAL | 0 refills | Status: DC

## 2019-05-19 MED ORDER — ONDANSETRON 4 MG PO TBDP: 4.0000 mg | ORAL_TABLET | Freq: Three times a day (TID) | ORAL | 0 refills | Status: DC | PRN

## 2019-05-19 NOTE — ED Provider Notes (Signed)
[facility] ____________________________________________  Time seen: Approximately 7:21 PM  I have reviewed the triage vital signs and the nursing notes.   HISTORY  Chief Complaint Generalized Body Aches   HPI Steven Horn is a 48 y.o. male presenting for evaluation of fatigue for the last 2 days with onset of body aches today.  Patient also reports of the last 2 days he has had some decrease appetite and intermittent nausea.  No vomiting or diarrhea.  Denies abdominal pain.  States has occasional cough, but reports chronic without any acute changes.  Denies any acute nasal congestion, sore throat, abdominal pain, dysuria, chest pain or shortness of breath.  Denies known direct sick contacts.  Overall continues to tolerate fluids well.  Patient states this feels like when he had the flu in the past.  Also reports noticing red rash to right inner thigh over last two days, unsure of origin, unsure of tick bite.  Reports food and drink continues to taste normally.  Denies aggravating or alleviating factors.  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   L ring finger, distal infection due to strep, req debridement then amputation, [facility]      Current Facility-Administered Medications:    ondansetron (ZOFRAN-ODT) disintegrating tablet 8 mg, 8 mg, Oral, Once, Renford DillsMiller, Shanna Un, NP  Current Outpatient Medications:    B Complex-C-E-Zn (B COMPLEX-C-E-ZINC) tablet, Take 1 tablet by mouth daily., Disp: , Rfl:    Esomeprazole  Magnesium (NEXIUM PO), Take 20 mg by mouth daily., Disp: , Rfl:    gabapentin (NEURONTIN) 600 MG tablet, TAKE 1 TABLET BY MOUTH EVERY MORNING AND 2 TABLETS BY MOUTH EVERY EVENING, Disp: 90 tablet, Rfl: 5   ibuprofen (ADVIL,MOTRIN) 800 MG tablet, Take 800 mg by mouth 2 (two) times daily., Disp: , Rfl:    lisinopril (ZESTRIL) 10 MG tablet, Take 1 tablet (10 mg total) by mouth daily., Disp: 30 tablet, Rfl: 11   rosuvastatin (CRESTOR) 10 MG tablet, Take 1 tablet (10 mg total) by mouth daily., Disp: 30 tablet, Rfl: 11   doxycycline (VIBRAMYCIN) 100 MG capsule, Take 1 capsule (100 mg total) by mouth 2 (two) times daily., Disp: 20 capsule, Rfl: 0   ondansetron (ZOFRAN ODT) 4 MG disintegrating tablet, Take 1 tablet (4 mg total) by mouth every 8 (eight) hours as needed., Disp: 15 tablet, Rfl: 0  Allergies Patient has no known allergies.  Family History  Problem Relation Age of Onset   Diabetes Mother    Hyperlipidemia Mother    Depression Father    Alcohol abuse Father    Hyperlipidemia Father    Hypertension Father    Hyperlipidemia Brother    Lung cancer Maternal Grandmother [phone]   Heart attack Maternal Grandfather [phone]   Heart attack Maternal Uncle [phone]    Social History Social History   Tobacco Use   Smoking status: Current Every Day Smoker    Packs/day: 1.00    Years: 32.00    Pack years: 32.00    Types: Cigarettes   Smokeless tobacco: Never Used  Substance Use  Topics   Alcohol use: Yes    Alcohol/week: 2.0 standard drinks    Types: 2 Standard drinks or equivalent per week    Frequency: Never   Drug use: Never    Review of Systems Constitutional: Positive body aches and chills. Eyes: No visual changes. ENT: No sore throat. Cardiovascular: Denies chest pain. Respiratory: Denies shortness of breath. Gastrointestinal: No abdominal pain.  Positive nausea nausea, no vomiting.  No diarrhea.  No constipation. Genitourinary: Negative for dysuria. Musculoskeletal:  Negative for back pain. Skin: Positive rash for rash. Neurological: Negative for headaches, focal weakness or numbness.    ____________________________________________   PHYSICAL EXAM:  VITAL SIGNS: ED Triage Vitals  Enc Vitals Group     BP 05/19/19 1802 135/89     Pulse Rate 05/19/19 1802 90     Resp 05/19/19 1802 18     Temp 05/19/19 1802 (!) 101.9 F (38.8 C)     Temp Source 05/19/19 1802 Oral     SpO2 05/19/19 1802 96 %     Weight 05/19/19 1800 210 lb (95.3 kg)     Height 05/19/19 1800 6\' 4"  (1.93 m)     Head Circumference --      Peak Flow --      Pain Score 05/19/19 1800 0     Pain Loc --      Pain Edu? --      Excl. in [place]? --     Constitutional: Alert and oriented. Well appearing and in no acute distress. Eyes: Conjunctivae are normal.  ENT      Head: Normocephalic and atraumatic. Neck: No stridor. Supple without meningismus.  Hematological/Lymphatic/Immunilogical: No cervical lymphadenopathy. Cardiovascular: Normal rate, regular rhythm. Grossly normal heart sounds.  Good peripheral circulation. Respiratory: Normal respiratory effort without tachypnea nor retractions. Breath sounds are clear and equal bilaterally. No wheezes, rales, rhonchi. Gastrointestinal: Soft and nontender.No CVA tenderness. Musculoskeletal:  No midline cervical, thoracic or lumbar tenderness to palpation.  Neurologic:  Normal speech and language. No gross focal neurologic deficits are appreciated. Speech is normal. Skin:  Skin is warm, dry.  Except: Right medial inner thigh area of approximately 6 x 6 cm erythema, warm to touch, nontender, no foreign body, no fluctuance, no induration. Psychiatric: Mood and affect are normal. Speech and behavior are normal. Patient exhibits appropriate insight and judgment   ___________________________________________   LABS (all labs ordered are listed, but only abnormal results are displayed)  Labs Reviewed  NOVEL CORONAVIRUS, NAA (HOSPITAL ORDER,  SEND-OUT TO REF LAB)  ROCKY MTN SPOTTED FVR ABS PNL(IGG+IGM)  B. BURGDORFI ANTIBODIES    PROCEDURES Procedures   INITIAL IMPRESSION / ASSESSMENT AND PLAN / ED COURSE  Pertinent labs & imaging results that were available during my care of the patient were reviewed by me and considered in my medical decision making (see chart for details).  2 days of fatigue with onset of body aches and fever today.  Overall well-appearing patient.  Suspect viral illness.  COVID-19 testing completed, will await results.  CDC is just information given and directed patient to follow, quarantine 10 days and until improved and no fever.  Also as right inner thigh erythema concerning for cellulitis, potential tick bite.  RMSF and Lyme labs sent and will empirically start on oral doxycycline.  PRN Zofran as needed.  Encourage fluids, rest, Tylenol or ibuprofen as needed.  1 g oral Tylenol given once in urgent care.  Discussed very strict follow-up and return parameters.Discussed indication, risks and benefits of  medications with patient.  Discussed follow up with Primary care physician this week. Discussed follow up and return parameters including no resolution or any worsening concerns. Patient verbalized understanding and agreed to plan.   ____________________________________________   FINAL CLINICAL IMPRESSION(S) / ED DIAGNOSES  Final diagnoses:  Fever, unspecified  Nausea without vomiting  Cellulitis of leg, right  Advice Given About Covid-19 Virus Infection     ED Discharge Orders         Ordered    doxycycline (VIBRAMYCIN) 100 MG capsule  2 times daily     05/19/19 1913    ondansetron (ZOFRAN ODT) 4 MG disintegrating tablet  Every 8 hours PRN     05/19/19 1913           Note: This dictation was prepared with Dragon dictation along with smaller phrase technology. Any transcriptional errors that result from this process are unintentional.         Renford DillsMiller, Nikolette Reindl, NP 05/19/19 1952

## 2019-05-19 NOTE — ED Triage Notes (Signed)
Patient complains of body aches, fever, no appetite and chills x Monday. Denies cough, congestion or shortness of breath

## 2019-05-19 NOTE — Discharge Instructions (Signed)
Take medication as prescribed. Rest. Drink plenty of fluids. Tylenol and ibuprofen as needed.  Please refer to [facility] DHHS information, remain home unless seeking further care.  Follow up with your primary care physician this week as needed. Return to Urgent care as needed.  Procedure with emergency room for worsening complaints.

## 2019-05-21 LAB — NOVEL CORONAVIRUS, NAA (HOSP ORDER, SEND-OUT TO REF LAB; TAT 18-24 HRS): SARS-CoV-2, NAA: NOT DETECTED

## 2019-05-21 LAB — B. BURGDORFI ANTIBODIES: B burgdorferi Ab IgG+IgM: <0.91 {ISR} (ref 0.00–0.90)

## 2019-05-21 LAB — ROCKY MTN SPOTTED FVR ABS PNL(IGG+IGM)
RMSF IgG: NEGATIVE
RMSF IgM: 0.28 index (ref 0.00–0.89)

## 2019-10-21 ENCOUNTER — Encounter: Payer: Self-pay | Admitting: Family Medicine

## 2019-10-21 ENCOUNTER — Ambulatory Visit (INDEPENDENT_AMBULATORY_CARE_PROVIDER_SITE_OTHER): Payer: Commercial Managed Care - PPO | Admitting: Family Medicine

## 2019-10-21 ENCOUNTER — Other Ambulatory Visit: Payer: Self-pay

## 2019-10-21 VITALS — BP 130/80 | HR 72 | Ht 76.0 in | Wt 211.0 lb

## 2019-10-21 DIAGNOSIS — M654 Radial styloid tenosynovitis [de Quervain]: Secondary | ICD-10-CM | POA: Diagnosis not present

## 2019-10-21 MED ORDER — PREDNISONE 20 MG PO TABS: ORAL_TABLET | ORAL | 0 refills | Status: DC

## 2019-10-21 MED ORDER — DICLOFENAC SODIUM 1 % EX GEL: 2.0000 g | Freq: Three times a day (TID) | CUTANEOUS | 2 refills | Status: DC | PRN

## 2019-10-21 NOTE — Progress Notes (Signed)
Virtual Visit via Telephone The purpose of this virtual visit is to provide medical care while limiting exposure to the novel coronavirus (COVID19) for both patient and office staff.  Consent was obtained for phone visit:  Yes.   Answered questions that patient had about telehealth interaction:  Yes.   I discussed the limitations, risks, security and privacy concerns of performing an evaluation and management service by telephone. I also discussed with the patient that there may be a patient responsible charge related to this service. The patient expressed understanding and agreed to proceed.  Patient Location: Home Provider Location: [facility])  ---------------------------------------------------------------------- Chief Complaint  Patient presents with  . Wrist Pain    onset 2 months getting worst onset month gradually increasing     S: Reviewed CMA documentation. I have called patient and gathered additional HPI as follows:  Right Wrist Pain Reports that symptoms started about 2 months ago but gradual onset worsening, he did have one episode around onset when he fell backwards hit his R hand/wrist on a wall was sore for few days initially, he had some mild soreness but no actual redness bruising swelling or acute pain initially - he continued to work and then 3 weeks later, started to start stinging pain at Thumb/Radial side of wrist near "ball of the wrist", worse if using wrist or grip with twisting motion, now worsening over that period of time, seems to be more sore and painful with stinging pain with repetitive activity twisting or grip. 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 - He tried copper tone gloves to help put some pressure on and that has helped during day, takes it off at night. - Takes Ibuprofen 800mg  PRN, Tylenol 500mg  x2 = 1000mg  PRN, mild relief only - Gabapentin 600mg  BID PRN for arthritis - Denies any swelling,  redness, bruising, weakness, other joint problem   Denies any known or suspected exposure to person with or possibly with COVID19.  Denies any fevers, chills, sweats, body ache, cough, shortness of breath, sinus pain or pressure, headache, abdominal pain, diarrhea  Past Medical History:  Diagnosis Date  . Hyperlipidemia   . Hypertension    Social History   Tobacco Use  . Smoking status: Current Every Day Smoker    Packs/day: 1.00    Years: 32.00    Pack years: 32.00    Types: Cigarettes  . Smokeless tobacco: Current User  Substance Use Topics  . Alcohol use: Yes    Alcohol/week: 2.0 standard drinks    Types: 2 Standard drinks or equivalent per week  . Drug use: Never    Current Outpatient Medications:  .  B Complex-C-E-Zn (B COMPLEX-C-E-ZINC) tablet, Take 1 tablet by mouth daily., Disp: , Rfl:  .  Esomeprazole Magnesium (NEXIUM PO), Take 20 mg by mouth daily., Disp: , Rfl:  .  gabapentin (NEURONTIN) 600 MG tablet, TAKE 1 TABLET BY MOUTH EVERY MORNING AND 2 TABLETS BY MOUTH EVERY EVENING (Patient taking differently: Patient is taking 1 tab in the morning and one in the evening), Disp: 90 tablet, Rfl: 5 .  ibuprofen (ADVIL,MOTRIN) 800 MG tablet, Take 800 mg by mouth 2 (two) times daily., Disp: , Rfl:  .  lisinopril (ZESTRIL) 10 MG tablet, Take 1 tablet (10 mg total) by mouth daily., Disp: 30 tablet, Rfl: 11 .  rosuvastatin (CRESTOR) 10 MG tablet, Take 1 tablet (10 mg total) by mouth daily., Disp: 30 tablet, Rfl: 11 .  diclofenac  Sodium (VOLTAREN) 1 % GEL, Apply 2 g topically 3 (three) times daily as needed (wrist joint pain)., Disp: 100 g, Rfl: 2 .  predniSONE (DELTASONE) 20 MG tablet, Take daily with food. Start with 60mg  (3 pills) x 2 days, then reduce to 40mg  (2 pills) x 2 days, then 20mg  (1 pill) x 3 days, Disp: 13 tablet, Rfl: 0  Depression screen [facility] 2/9 10/21/2019 02/17/2019 12/29/2017  Decreased Interest 0 0 0  Down, Depressed, Hopeless 0 0 0  PHQ - 2 Score 0 0 0    No  flowsheet data found.  -------------------------------------------------------------------------- O: No physical exam performed due to remote telephone encounter.  Lab results reviewed.  No results found for this or any previous visit (from the past 2160 hour(s)).  -------------------------------------------------------------------------- A&P:  Problem List Items Addressed This Visit    None    Visit Diagnoses    De Quervain's tenosynovitis, right    -  Primary   Relevant Medications   predniSONE (DELTASONE) 20 MG tablet   diclofenac Sodium (VOLTAREN) 1 % GEL     Suspected Right DeQuervain's tenosynovitis Did have one acute injury with contusion or minor trauma while falling caught himself with hand/wrist But overall main concern of symptoms flared up due to recent repetitive overuse and some heavy lifting likely put additional strain on wrist APL/EPB tendons with localized symptoms  Currently >2 month onset. Cannot rule out fracture but again history is less supportive, difficult since unable to examine virtually. Offered X-ray as option, but given course of symptoms we agree to hold for now and start treatment and determine progress/course and can X-ray at anytime if not improving or new concerns.  Plan: 1. Start anti-inflammatory with Prednisone burst x 7 days - Stop ibuprofen. Start topical diclofenac TID PRN in this local area - can use first or wait til finish prednisone 2. May take Tylenol PRN breakthrough 3. Start Thumb Spica Wrist Splint (OTC) for support and avoid repetitive strain, allow tendons to heal. Activity modification and relative rest 4. May try topical heat/ice PRN 5. Follow-up if not improving 4-6 weeks   Meds ordered this encounter  Medications  . predniSONE (DELTASONE) 20 MG tablet    Sig: Take daily with food. Start with 60mg  (3 pills) x 2 days, then reduce to 40mg  (2 pills) x 2 days, then 20mg  (1 pill) x 3 days    Dispense:  13 tablet    Refill:  0   . diclofenac Sodium (VOLTAREN) 1 % GEL    Sig: Apply 2 g topically 3 (three) times daily as needed (wrist joint pain).    Dispense:  100 g    Refill:  2    Follow-up: - Return in 4-6 weeks if not improving tendonitis  Patient verbalizes understanding with the above medical recommendations including the limitation of remote medical advice.  Specific follow-up and call-back criteria were given for patient to follow-up or seek medical care more urgently if needed.   - Time spent in direct consultation with patient on phone: 11 minutes  02/19/2019, DO [facility] 10/21/2019, 3:05 PM

## 2019-10-21 NOTE — Patient Instructions (Addendum)
You most likely have Right hand DeQuervains Tenosynovitis - This is a type of tendonitis involving the tendons from the thumb into the wrist, it is a very common spot for inflammation and pain, usually caused by repetitive activities (lifting, writing, typing, carrying, worse with heavier objective or more repetitive strain) - Once it is flared up it can continue to persist for days to weeks due to inflammation, and mostly importantly needs rest and time to heal  Start topical anti inflammatory medicine Diclofenac (or can wait until after prednisone finished, either way is fine)  - Start Steroid Prednisone taper as prescribed - DO NOT TAKE any ibuprofen, aleve, motrin while you are taking this medicine Once finished prednisone then can resume some Ibuprofen if need  - It is safe to take Tylenol Ext Str 500mg  tabs - take 1 to 2 (max dose 1000mg ) every 6 hours as needed for breakthrough pain, max 24 hour daily dose is 6 to 8 tablets or 4000mg   - Wrist splint will provide support to the tendons and reduce strain - Purchase a THUMB SPICA SPLINT (to limit thumb and wrist flexion) - REST is extremely important, the goal is to AVOID re-injury, try to modify activities - Also may try ice packs if swelling, or topical icy hot muscle rub can also help ease worsening pain temporarily  If you get significant worsening pain, weakness in your hand (not due to pain), numbness, tingling, burning, more constant without activity, then follow-up sooner as we may need to consider stronger anti-inflammatory with prednisone, or referral for injection.  Please schedule a follow-up appointment with Dr. in 4 - 6 weeks as needed for hand/wrist pain if not improving  Consider X-ray or referral   Please schedule a Follow-up Appointment to: Return in about 4 weeks (around 11/18/2019), or if symptoms worsen or fail to improve, for wrist pain tendonitis.  If you have any other questions or concerns, please  feel free to call the office or send a message through MyChart. You may also schedule an earlier appointment if necessary.  Additionally, you may be receiving a survey about your experience at our office within a few days to 1 week by e-mail or mail. We value your feedback.  , DO [facility], 11/20/2019

## 2019-11-15 ENCOUNTER — Other Ambulatory Visit: Payer: Commercial Managed Care - PPO

## 2019-11-15 ENCOUNTER — Other Ambulatory Visit: Payer: Self-pay

## 2019-11-16 LAB — CBC WITH DIFFERENTIAL/PLATELET
Absolute Monocytes: 324 cells/uL (ref 200–950)
Basophils Absolute: 28 cells/uL (ref 0–200)
Basophils Relative: 0.6 %
Eosinophils Absolute: 465 cells/uL (ref 15–500)
Eosinophils Relative: 9.9 %
HCT: 40.6 % (ref 38.5–50.0)
Hemoglobin: 13.8 g/dL (ref 13.2–17.1)
Lymphs Abs: 1979 cells/uL (ref 850–3900)
MCH: 30.5 pg (ref 27.0–33.0)
MCHC: 34 g/dL (ref 32.0–36.0)
MCV: 89.6 fL (ref 80.0–100.0)
MPV: 10.8 fL (ref 7.5–12.5)
Monocytes Relative: 6.9 %
Neutro Abs: 1904 cells/uL (ref 1500–7800)
Neutrophils Relative %: 40.5 %
Platelets: 199 10*3/uL (ref 140–400)
RBC: 4.53 10*6/uL (ref 4.20–5.80)
RDW: 13.1 % (ref 11.0–15.0)
Total Lymphocyte: 42.1 %
WBC: 4.7 10*3/uL (ref 3.8–10.8)

## 2019-11-16 LAB — LIPID PANEL
Cholesterol: 162 mg/dL (ref <200)
HDL: 52 mg/dL (ref >=40)
LDL Cholesterol (Calc): 94 mg/dL (calc)
Non-HDL Cholesterol (Calc): 110 mg/dL (calc) (ref <130)
Total CHOL/HDL Ratio: 3.1 (calc) (ref <5.0)
Triglycerides: 74 mg/dL (ref <150)

## 2019-11-16 LAB — COMPLETE METABOLIC PANEL WITH GFR
AG Ratio: 1.8 (calc) (ref 1.0–2.5)
ALT: 19 U/L (ref 9–46)
AST: 18 U/L (ref 10–35)
Albumin: 4.3 g/dL (ref 3.6–5.1)
Alkaline phosphatase (APISO): 43 U/L (ref 35–144)
BUN: 13 mg/dL (ref 7–25)
CO2: 25 mmol/L (ref 20–32)
Calcium: 9.3 mg/dL (ref 8.6–10.3)
Chloride: 107 mmol/L (ref 98–110)
Creat: 0.79 mg/dL (ref 0.70–1.33)
GFR, Est African American: 121 mL/min/{1.73_m2} (ref >=60)
GFR, Est Non African American: 105 mL/min/{1.73_m2} (ref >=60)
Globulin: 2.4 g/dL (calc) (ref 1.9–3.7)
Glucose, Bld: 103 mg/dL — ABNORMAL HIGH (ref 65–99)
Potassium: 4.3 mmol/L (ref 3.5–5.3)
Sodium: 140 mmol/L (ref 135–146)
Total Bilirubin: 0.6 mg/dL (ref 0.2–1.2)
Total Protein: 6.7 g/dL (ref 6.1–8.1)

## 2019-11-16 LAB — HEMOGLOBIN A1C
Hgb A1c MFr Bld: 5.3 % of total Hgb (ref <5.7)
Mean Plasma Glucose: 105 (calc)
eAG (mmol/L): 5.8 (calc)

## 2019-11-16 LAB — PSA: PSA: 0.4 ng/mL (ref <=4.0)

## 2019-11-17 ENCOUNTER — Encounter: Payer: Commercial Managed Care - PPO | Admitting: Family Medicine

## 2019-11-19 ENCOUNTER — Ambulatory Visit (INDEPENDENT_AMBULATORY_CARE_PROVIDER_SITE_OTHER): Payer: Commercial Managed Care - PPO | Admitting: Family Medicine

## 2019-11-19 ENCOUNTER — Encounter: Payer: Self-pay | Admitting: Family Medicine

## 2019-11-19 ENCOUNTER — Other Ambulatory Visit: Payer: Self-pay

## 2019-11-19 VITALS — BP 138/89 | Ht 76.0 in | Wt 211.0 lb

## 2019-11-19 DIAGNOSIS — Z0000 Encounter for general adult medical examination without abnormal findings: Secondary | ICD-10-CM | POA: Diagnosis not present

## 2019-11-19 DIAGNOSIS — M5136 Other intervertebral disc degeneration, lumbar region: Secondary | ICD-10-CM

## 2019-11-19 DIAGNOSIS — I10 Essential (primary) hypertension: Secondary | ICD-10-CM | POA: Diagnosis not present

## 2019-11-19 DIAGNOSIS — E785 Hyperlipidemia, unspecified: Secondary | ICD-10-CM

## 2019-11-19 DIAGNOSIS — M5441 Lumbago with sciatica, right side: Secondary | ICD-10-CM | POA: Diagnosis not present

## 2019-11-19 DIAGNOSIS — Z1211 Encounter for screening for malignant neoplasm of colon: Secondary | ICD-10-CM

## 2019-11-19 DIAGNOSIS — G8929 Other chronic pain: Secondary | ICD-10-CM

## 2019-11-19 MED ORDER — GABAPENTIN 600 MG PO TABS: 600.0000 mg | ORAL_TABLET | Freq: Two times a day (BID) | ORAL | 11 refills | Status: DC

## 2019-11-19 MED ORDER — LISINOPRIL 10 MG PO TABS: 10.0000 mg | ORAL_TABLET | Freq: Every day | ORAL | 11 refills | Status: DC

## 2019-11-19 MED ORDER — ROSUVASTATIN CALCIUM 10 MG PO TABS: 10.0000 mg | ORAL_TABLET | Freq: Every day | ORAL | 11 refills | Status: DC

## 2019-11-19 NOTE — Assessment & Plan Note (Signed)
Stable chronic problem  Refill Gabapentin 600 BID

## 2019-11-19 NOTE — Assessment & Plan Note (Signed)
Controlled HTN No known complications   Plan:  1. Continue current BP regimen Lisinopril 10mg daily re order 2. Encourage improved lifestyle - low sodium diet 3. Continue to monitor BP outside office, bring readings to next visit, if persistently >140/90 or new symptoms notify office sooner 

## 2019-11-19 NOTE — Patient Instructions (Addendum)
Referral to [facility] for initial colonoscopy screening for colon cancer. Stay tuned for apt.  [facility]) [address] [place], [place] [address] Main: [phone]  Refilled meds.  Keep on cholesterol med it is helping prevent future risk of heart disease or heart attack / stroke.  Please schedule a Follow-up Appointment to: Return in about 1 year (around 11/18/2020) for Annual Physical.  If you have any other questions or concerns, please feel free to call the office or send a message through MyChart. You may also schedule an earlier appointment if necessary.  Additionally, you may be receiving a survey about your experience at our office within a few days to 1 week by e-mail or mail. We value your feedback.  Saralyn Pilar, DO [facility], [place]

## 2019-11-19 NOTE — Progress Notes (Signed)
Virtual Visit via Telephone The purpose of this virtual visit is to provide medical care while limiting exposure to the novel coronavirus (COVID19) for both patient and office staff.  Consent was obtained for phone visit:  Yes.   Answered questions that patient had about telehealth interaction:  Yes.   I discussed the limitations, risks, security and privacy concerns of performing an evaluation and management service by telephone. I also discussed with the patient that there may be a patient responsible charge related to this service. The patient expressed understanding and agreed to proceed.  Patient Location: Home Provider Location: [facility])  ---------------------------------------------------------------------- Chief Complaint  Patient presents with  . Annual Exam    S: Reviewed CMA documentation. I have called patient and gathered additional HPI as follows:  Here for Annual Physical and Lab Review.  CHRONIC HTN: Reportsno concerns with BP currently. Checks BP occasionally Current Meds -Lisinopril 10mg  daily Reports good compliance, took meds today. Tolerating well, w/o complaints. Denies CP, dyspnea, HA, edema, dizziness / lightheadedness  HYPERLIPIDEMIA / BMI >25 - Reports no concernscurrently but he does have early family history of MI / CAD on maternal side - Currently takingRosuvastatin 10mg  daily, tolerating well without side effects or myalgias - asking if he should continue med Lifestyle - Diet:Improved diet, healthier options, not eating out currently, drinks water, some other low sugar drinks. - Exercise:active with work physical job, no regular exercise - Maternal family history uncle passed age 24 MI  Chronic Low Back Pain, DDD Lumbar Spine / History of OA/DJD in other joints (hands R > L) Reports no prior actual back injury, >15+ years of this issue, seems to be gradual  - Previously had seen [facility], and then  eventually went to Ortho / Pain for repeat MRI, and he has received ESI in past with limited. - Taking Gabapentin 600mg  usually 1 tab twice a day now - Taking Ibuprofen 800mg  BID and Tylenol 325mg  BID, few weeks at a time for flares   GERD  Denies any high risk travel to areas of current concern for COVID19. Denies any known or suspected exposure to person with or possibly with COVID19.  Denies any fevers, chills, sweats, body ache, cough, shortness of breath, sinus pain or pressure, headache, abdominal pain, diarrhea  Health Maintenance:   Past Medical History:  Diagnosis Date  . Hyperlipidemia   . Hypertension    Social History   Tobacco Use  . Smoking status: Current Every Day Smoker    Packs/day: 1.00    Years: 32.00    Pack years: 32.00    Types: Cigarettes  . Smokeless tobacco: Current User  Substance Use Topics  . Alcohol use: Yes    Alcohol/week: 2.0 standard drinks    Types: 2 Standard drinks or equivalent per week  . Drug use: Never    Current Outpatient Medications:  .  B Complex-C-E-Zn (B COMPLEX-C-E-ZINC) tablet, Take 1 tablet by mouth daily., Disp: , Rfl:  .  diclofenac Sodium (VOLTAREN) 1 % GEL, Apply 2 g topically 3 (three) times daily as needed (wrist joint pain)., Disp: 100 g, Rfl: 2 .  Esomeprazole Magnesium (NEXIUM PO), Take 20 mg by mouth daily., Disp: , Rfl:  .  gabapentin (NEURONTIN) 600 MG tablet, Take 1 tablet (600 mg total) by mouth 2 (two) times daily., Disp: 60 tablet, Rfl: 11 .  ibuprofen (ADVIL,MOTRIN) 800 MG tablet, Take 800 mg by mouth 2 (two) times daily., Disp: , Rfl:  .  lisinopril (ZESTRIL) 10 MG tablet, Take 1 tablet (10 mg total) by mouth daily., Disp: 30 tablet, Rfl: 11 .  rosuvastatin (CRESTOR) 10 MG tablet, Take 1 tablet (10 mg total) by mouth daily., Disp: 30 tablet, Rfl: 11  Depression screen [facility] 2/9 11/19/2019 10/21/2019 02/17/2019  Decreased Interest 0 0 0  Down, Depressed, Hopeless 0 0 0  PHQ - 2 Score 0 0 0    No flowsheet  data found.  -------------------------------------------------------------------------- O: No physical exam performed due to remote telephone encounter.  BP 138/89   Ht 6\' 4"  (1.93 m)   Wt 211 lb (95.7 kg)   BMI 25.68 kg/m    Lab results reviewed.  Recent Results (from the past 2160 hour(s))  PSA     Status: None   Collection Time: 11/15/19  8:51 AM  Result Value Ref Range   PSA 0.4 < OR = 4.0 ng/mL    Comment: The total PSA value from this assay system is  standardized against the WHO standard. The test  result will be approximately 20% lower when compared  to the equimolar-standardized total PSA (Beckman  Coulter). Comparison of serial PSA results should be  interpreted with this fact in mind. . This test was performed using the Siemens  chemiluminescent method. Values obtained from  different assay methods cannot be used interchangeably. PSA levels, regardless of value, should not be interpreted as absolute evidence of the presence or absence of disease.   Lipid panel     Status: None   Collection Time: 11/15/19  8:51 AM  Result Value Ref Range   Cholesterol 162 <200 mg/dL   HDL 52 > OR = 40 mg/dL   Triglycerides 74 <150 mg/dL   LDL Cholesterol (Calc) 94 mg/dL (calc)    Comment: Reference range: <100 . Desirable range <100 mg/dL for primary prevention;   <70 mg/dL for patients with CHD or diabetic patients  with > or = 2 CHD risk factors. Marland Kitchen LDL-C is now calculated using the Martin-Hopkins  calculation, which is a validated novel method providing  better accuracy than the Friedewald equation in the  estimation of LDL-C.  Cresenciano Genre et al. Annamaria Helling. 1962;229(79): 2061-2068  (http://education.[employer].com/faq/FAQ164)    Total CHOL/HDL Ratio 3.1 <5.0 (calc)   Non-HDL Cholesterol (Calc) 110 <130 mg/dL (calc)    Comment: For patients with diabetes plus 1 major ASCVD risk  factor, treating to a non-HDL-C goal of <100 mg/dL  (LDL-C of <70 mg/dL) is considered a  therapeutic  option.   COMPLETE METABOLIC PANEL WITH GFR     Status: Abnormal   Collection Time: 11/15/19  8:51 AM  Result Value Ref Range   Glucose, Bld 103 (H) 65 - 99 mg/dL    Comment: .            Fasting reference interval . For someone without known diabetes, a glucose value between 100 and 125 mg/dL is consistent with prediabetes and should be confirmed with a follow-up test. .    BUN 13 7 - 25 mg/dL   Creat 0.79 0.70 - 1.33 mg/dL    Comment: For patients >37 years of age, the reference limit for Creatinine is approximately 13% higher for people identified as African-American. .    GFR, Est Non African American 105 > OR = 60 mL/min/1.30m2   GFR, Est African American 121 > OR = 60 mL/min/1.30m2   BUN/Creatinine Ratio NOT APPLICABLE 6 - 22 (calc)   Sodium 140 135 - 146 mmol/L   Potassium  4.3 3.5 - 5.3 mmol/L   Chloride 107 98 - 110 mmol/L   CO2 25 20 - 32 mmol/L   Calcium 9.3 8.6 - 10.3 mg/dL   Total Protein 6.7 6.1 - 8.1 g/dL   Albumin 4.3 3.6 - 5.1 g/dL   Globulin 2.4 1.9 - 3.7 g/dL (calc)   AG Ratio 1.8 1.0 - 2.5 (calc)   Total Bilirubin 0.6 0.2 - 1.2 mg/dL   Alkaline phosphatase (APISO) 43 35 - 144 U/L   AST 18 10 - 35 U/L   ALT 19 9 - 46 U/L  CBC with Differential/Platelet     Status: None   Collection Time: 11/15/19  8:51 AM  Result Value Ref Range   WBC 4.7 3.8 - 10.8 Thousand/uL   RBC 4.53 4.20 - 5.80 Million/uL   Hemoglobin 13.8 13.2 - 17.1 g/dL   HCT 99.2 42.6 - 83.4 %   MCV 89.6 80.0 - 100.0 fL   MCH 30.5 27.0 - 33.0 pg   MCHC 34.0 32.0 - 36.0 g/dL   RDW 19.6 22.2 - 97.9 %   Platelets 199 140 - 400 Thousand/uL   MPV 10.8 7.5 - 12.5 fL   Neutro Abs 1,904 1,500 - 7,800 cells/uL   Lymphs Abs 1,979 850 - 3,900 cells/uL   Absolute Monocytes 324 200 - 950 cells/uL   Eosinophils Absolute 465 15 - 500 cells/uL   Basophils Absolute 28 0 - 200 cells/uL   Neutrophils Relative % 40.5 %   Total Lymphocyte 42.1 %   Monocytes Relative 6.9 %   Eosinophils  Relative 9.9 %   Basophils Relative 0.6 %  Hemoglobin A1c     Status: None   Collection Time: 11/15/19  8:51 AM  Result Value Ref Range   Hgb A1c MFr Bld 5.3 <5.7 % of total Hgb    Comment: For the purpose of screening for the presence of diabetes: . <5.7%       Consistent with the absence of diabetes 5.7-6.4%    Consistent with increased risk for diabetes             (prediabetes) > or =6.5%  Consistent with diabetes . This assay result is consistent with a decreased risk of diabetes. . Currently, no consensus exists regarding use of hemoglobin A1c for diagnosis of diabetes in children. . According to American Diabetes Association (ADA) guidelines, hemoglobin A1c <7.0% represents optimal control in non-pregnant diabetic patients. Different metrics may apply to specific patient populations.  Standards of Medical Care in Diabetes(ADA). .    Mean Plasma Glucose 105 (calc)   eAG (mmol/L) 5.8 (calc)    -------------------------------------------------------------------------- A&P:  Problem List Items Addressed This Visit    Hyperlipidemia    Controlled cholesterol on statin and lifestyle Last lipid panel 11/2019  Plan: 1. Continue current meds - Rosuvastatin 10mg  daily, tolerating well, advise to keep for ASCVD risk reduction w/ his high risk fam history heart disease 2. Encourage improved lifestyle - low carb/cholesterol, reduce portion size, continue improving regular exercise      Relevant Medications   rosuvastatin (CRESTOR) 10 MG tablet   lisinopril (ZESTRIL) 10 MG tablet   Essential hypertension    Controlled HTN No known complications   Plan:  1. Continue current BP regimen Lisinopril 10mg  daily re order 2. Encourage improved lifestyle - low sodium diet 3. Continue to monitor BP outside office, bring readings to next visit, if persistently >140/90 or new symptoms notify office sooner      Relevant Medications  rosuvastatin (CRESTOR) 10 MG tablet    lisinopril (ZESTRIL) 10 MG tablet   DDD (degenerative disc disease), lumbar   Relevant Medications   gabapentin (NEURONTIN) 600 MG tablet   Chronic bilateral low back pain with right-sided sciatica    Stable chronic problem  Refill Gabapentin 600 BID      Relevant Medications   gabapentin (NEURONTIN) 600 MG tablet    Other Visit Diagnoses    Annual physical exam    -  Primary   Colon cancer screening       Relevant Orders   Ambulatory referral to Gastroenterology     Updated Health Maintenance information - Referral to [facility] for initial screening colonoscopy Reviewed recent lab results with patient Encouraged improvement to lifestyle with diet and exercise - Goal of weight loss   Orders Placed This Encounter  Procedures  . Ambulatory referral to Gastroenterology    Referral Priority:   Routine    Referral Type:   Consultation    Referral Reason:   Specialty Services Required    Number of Visits Requested:   1    Meds ordered this encounter  Medications  . rosuvastatin (CRESTOR) 10 MG tablet    Sig: Take 1 tablet (10 mg total) by mouth daily.    Dispense:  30 tablet    Refill:  11  . lisinopril (ZESTRIL) 10 MG tablet    Sig: Take 1 tablet (10 mg total) by mouth daily.    Dispense:  30 tablet    Refill:  11  . gabapentin (NEURONTIN) 600 MG tablet    Sig: Take 1 tablet (600 mg total) by mouth 2 (two) times daily.    Dispense:  60 tablet    Refill:  11    Follow-up: - Return in 1 year for annual physical  Patient verbalizes understanding with the above medical recommendations including the limitation of remote medical advice.  Specific follow-up and call-back criteria were given for patient to follow-up or seek medical care more urgently if needed.   - Time spent in direct consultation with patient on phone: 15 minutes   Saralyn Pilar, DO [facility] 11/19/2019, 3:42 PM

## 2019-11-19 NOTE — Assessment & Plan Note (Signed)
Controlled cholesterol on statin and lifestyle Last lipid panel 11/2019  Plan: 1. Continue current meds - Rosuvastatin 10mg  daily, tolerating well, advise to keep for ASCVD risk reduction w/ his high risk fam history heart disease 2. Encourage improved lifestyle - low carb/cholesterol, reduce portion size, continue improving regular exercise

## 2019-12-01 NOTE — Addendum Note (Signed)
Addended by: Smitty Cords on: 12/01/2019 04:46 PM   Modules accepted: Level of Service

## 2019-12-06 ENCOUNTER — Telehealth: Payer: Self-pay | Admitting: Family Medicine

## 2019-12-06 ENCOUNTER — Telehealth: Payer: Self-pay

## 2019-12-06 ENCOUNTER — Other Ambulatory Visit: Payer: Self-pay

## 2019-12-06 ENCOUNTER — Telehealth: Payer: Self-pay | Admitting: Gastroenterology

## 2019-12-06 DIAGNOSIS — Z1211 Encounter for screening for malignant neoplasm of colon: Secondary | ICD-10-CM

## 2019-12-06 NOTE — Telephone Encounter (Signed)
Patient was referred to GI for colonoscopy for screening. He cancelled colonoscopy apt and requested Cologuard test instead.  Can you call patient to confirm his request?  If he wants [place] to order Cologuard, just let me know and I can place order.  Saralyn Pilar, DO [facility] 12/06/2019, 2:12 PM

## 2019-12-06 NOTE — Telephone Encounter (Signed)
Left message for patient to call back  

## 2019-12-06 NOTE — Telephone Encounter (Signed)
Patient called & l/m on v/m stating he wanted to cancel his colonoscopy schedule for 02-04-2020 due to work & home schedule. He is leaning toward a colorgaurd study but if can get schedule worked out he will call here to schedule procedure.

## 2019-12-06 NOTE — Telephone Encounter (Signed)
Returned patients call.  LVM to let him know that I will go ahead and cancel his May 7th Colonoscopy.  Thanks,  [place], [place]

## 2019-12-06 NOTE — Telephone Encounter (Signed)
Gastroenterology Pre-Procedure Review  Request Date: May 7th, 2021 Requesting Physician: Dr. Allegra Lai  PATIENT REVIEW QUESTIONS: The patient responded to the following health history questions as indicated:    1. Are you having any GI issues? no 2. Do you have a personal history of Polyps? no 3. Do you have a family history of Colon Cancer or Polyps? no 4. Diabetes Mellitus? no 5. Joint replacements in the past 12 months?no 6. Major health problems in the past 3 months?no 7. Any artificial heart valves, MVP, or defibrillator?no    MEDICATIONS & ALLERGIES:    Patient reports the following regarding taking any anticoagulation/antiplatelet therapy:   Plavix, Coumadin, Eliquis, Xarelto, Lovenox, Pradaxa, Brilinta, or Effient? no Aspirin? no  Patient confirms/reports the following medications:  Current Outpatient Medications  Medication Sig Dispense Refill  . B Complex-C-E-Zn (B COMPLEX-C-E-ZINC) tablet Take 1 tablet by mouth daily.    . diclofenac Sodium (VOLTAREN) 1 % GEL Apply 2 g topically 3 (three) times daily as needed (wrist joint pain). 100 g 2  . Esomeprazole Magnesium (NEXIUM PO) Take 20 mg by mouth daily.    Marland Kitchen gabapentin (NEURONTIN) 600 MG tablet Take 1 tablet (600 mg total) by mouth 2 (two) times daily. 60 tablet 11  . ibuprofen (ADVIL,MOTRIN) 800 MG tablet Take 800 mg by mouth 2 (two) times daily.    Marland Kitchen lisinopril (ZESTRIL) 10 MG tablet Take 1 tablet (10 mg total) by mouth daily. 30 tablet 11  . rosuvastatin (CRESTOR) 10 MG tablet Take 1 tablet (10 mg total) by mouth daily. 30 tablet 11   No current facility-administered medications for this visit.    Patient confirms/reports the following allergies:  No Known Allergies  No orders of the defined types were placed in this encounter.   AUTHORIZATION INFORMATION Primary Insurance: 1D#: Group #:  Secondary Insurance: 1D#: Group #:  SCHEDULE INFORMATION: Date: May 7th, 2021 Time: Location:[facility]

## 2019-12-07 ENCOUNTER — Telehealth: Payer: Self-pay | Admitting: Family Medicine

## 2019-12-07 DIAGNOSIS — Z1211 Encounter for screening for malignant neoplasm of colon: Secondary | ICD-10-CM

## 2019-12-07 NOTE — Telephone Encounter (Signed)
Confirmed with the patient he changed his mind and does want Cologuard.

## 2019-12-07 NOTE — Telephone Encounter (Signed)
Signed cologuard order  Saralyn Pilar, DO [facility] 12/07/2019, 12:08 PM

## 2019-12-31 LAB — COLOGUARD: Cologuard: NEGATIVE

## 2020-01-01 LAB — COLOGUARD: COLOGUARD: NEGATIVE

## 2020-01-01 LAB — EXTERNAL GENERIC LAB PROCEDURE: COLOGUARD: NEGATIVE

## 2020-01-04 ENCOUNTER — Encounter: Payer: Self-pay | Admitting: Family Medicine

## 2020-02-04 ENCOUNTER — Ambulatory Visit: Admit: 2020-02-04 | Payer: 59 | Admitting: Gastroenterology

## 2020-02-04 SURGERY — COLONOSCOPY WITH PROPOFOL
Anesthesia: General

## 2020-02-21 ENCOUNTER — Other Ambulatory Visit: Payer: Self-pay | Admitting: Family Medicine

## 2020-02-21 DIAGNOSIS — M5136 Other intervertebral disc degeneration, lumbar region: Secondary | ICD-10-CM

## 2020-02-21 DIAGNOSIS — M5441 Lumbago with sciatica, right side: Secondary | ICD-10-CM

## 2020-04-18 ENCOUNTER — Other Ambulatory Visit: Payer: Self-pay | Admitting: Family Medicine

## 2020-04-18 DIAGNOSIS — G8929 Other chronic pain: Secondary | ICD-10-CM

## 2020-04-18 DIAGNOSIS — M5136 Other intervertebral disc degeneration, lumbar region: Secondary | ICD-10-CM

## 2020-05-11 DIAGNOSIS — M5441 Lumbago with sciatica, right side: Secondary | ICD-10-CM

## 2020-05-11 DIAGNOSIS — E785 Hyperlipidemia, unspecified: Secondary | ICD-10-CM

## 2020-05-11 DIAGNOSIS — M5136 Other intervertebral disc degeneration, lumbar region: Secondary | ICD-10-CM

## 2020-05-11 DIAGNOSIS — I10 Essential (primary) hypertension: Secondary | ICD-10-CM

## 2020-05-11 DIAGNOSIS — G8929 Other chronic pain: Secondary | ICD-10-CM

## 2020-05-15 MED ORDER — ROSUVASTATIN CALCIUM 10 MG PO TABS: 10.0000 mg | ORAL_TABLET | Freq: Every day | ORAL | 2 refills | Status: DC

## 2020-05-15 MED ORDER — LISINOPRIL 10 MG PO TABS: 10.0000 mg | ORAL_TABLET | Freq: Every day | ORAL | 2 refills | Status: DC

## 2020-05-15 MED ORDER — GABAPENTIN 600 MG PO TABS: ORAL_TABLET | ORAL | 2 refills | Status: DC

## 2020-06-29 ENCOUNTER — Telehealth: Payer: Self-pay

## 2020-06-29 NOTE — Telephone Encounter (Signed)
Copied from CRM [id number]. Topic: General - Other >> Jun 29, 2020  8:35 AM Jaquita Rector A wrote: Reason for CRM: Patient wife Selena Batten called in to say that husband have been having a scratchy throat on 06/26/20 now a cough with sinus drainage asking if there is something that Dr Kirtland Bouchard can prescribe. Asking for a call back at Ph# [phone]

## 2020-06-29 NOTE — Telephone Encounter (Signed)
As per patient's spouse he had cough and taken OTC Mucinex DM which helped loosen the cough but still has sinus drainage and wanted Rx option before schedule appointment 8:40 am tomorrow 06/30/2020 inform her needed appointment for better evaluation and treatment.

## 2020-06-29 NOTE — Telephone Encounter (Signed)
Would encourage a COVID test (either at our clinic from 5:30-6:30pm this evening and they can make an appt online), at a local pharmacy or by completing a rapid COVID test from the store.  Would recommend symptom treatment, such as warm salt water gargles, any cough drops with benzocaine in them as they can be soothing, over the counter cough medicine such as Coriciden HBP (safe for patients with hypertension).

## 2020-06-30 ENCOUNTER — Telehealth (INDEPENDENT_AMBULATORY_CARE_PROVIDER_SITE_OTHER): Payer: Commercial Managed Care - PPO | Admitting: Family Medicine

## 2020-06-30 ENCOUNTER — Other Ambulatory Visit: Payer: Self-pay

## 2020-06-30 ENCOUNTER — Encounter: Payer: Self-pay | Admitting: Family Medicine

## 2020-06-30 VITALS — BP 120/77 | Temp 97.0°F

## 2020-06-30 DIAGNOSIS — B349 Viral infection, unspecified: Secondary | ICD-10-CM

## 2020-06-30 MED ORDER — GUAIFENESIN ER 600 MG PO TB12: 1200.0000 mg | ORAL_TABLET | Freq: Two times a day (BID) | ORAL | 0 refills | Status: AC

## 2020-06-30 MED ORDER — AZELASTINE HCL 0.1 % NA SOLN: 2.0000 | Freq: Two times a day (BID) | NASAL | 12 refills | Status: DC

## 2020-06-30 MED ORDER — BENZONATATE 100 MG PO CAPS: 100.0000 mg | ORAL_CAPSULE | Freq: Three times a day (TID) | ORAL | 0 refills | Status: AC | PRN

## 2020-06-30 NOTE — Patient Instructions (Signed)
I have sent in a prescription for mucinex 1200mg  to take this twice a day for the next 10 days  Can use the nasal spray azelastine to help with drying up secretions and post nasal drip  Sent in a prescription for tessalon perles to take as directed for cough  It is encouraged that you be tested for COVID-19.  This can be done with a home self test available at the local pharmacies and [facility]  If you begin to have worsening shortness of breath, chest pain, fever over 104 that is not responsive to ibuprofen and/or acetaminophen, or impending sense of doom to PROCEED TO THE EMERGENCY ROOM IMMEDIATELY!  We will plan to see you back if your symptoms worsen or fail to improve  You will receive a survey after today's visit either digitally by e-mail or paper by USPS mail. Your experiences and feedback matter to .  Please respond so we know how we are doing as we provide care for you.  Call us with any questions/concerns/needs.  It is my goal to be available to you for your health concerns.  Thanks for choosing me to be a partner in your healthcare needs!  [place], FNP-C Family Nurse Practitioner [facility] Phone: [phone]

## 2020-06-30 NOTE — Progress Notes (Signed)
Virtual Visit via MyChart Video Visit  The purpose of this virtual visit is to provide medical care while limiting exposure to the novel coronavirus (COVID19) for both patient and office staff.  Consent was obtained for phone visit:  Yes.   Answered questions that patient had about telehealth interaction:  Yes.   I discussed the limitations, risks, security and privacy concerns of performing an evaluation and management service by telephone. I also discussed with the patient that there may be a patient responsible charge related to this service. The patient expressed understanding and agreed to proceed.  Patient is at home and is accessed via Restaurant manager, fast food Visit Services are provided by Charlaine Dalton, FNP-C from [facility])  ---------------------------------------------------------------------- Chief Complaint  Patient presents with  . Sore Throat    scrathcy throat, cough w/ discomfort in the chest from the cough, productive cough, nasal congestion, runny nose, diarrhea x 2 days and mild post nasal drainage x 5 days. Pt was exposed to his grandson who had some respiratory issues over the weekend. Pt has not been vaccinated.     S: Reviewed CMA documentation. I have called patient and gathered additional HPI as follows:  Steven Horn presents for MyChart Video Visit with concerns of sore throat, nasal congestion with watery nasal drainage and post nasal drip.  He has been starting to feel better, previously was having a dry cough that was hoarse and would make his chest hurt.  Currently feels like he has a rattling cough and can get some mucus up.  Reports talking for too long dries his throat and then he started to cough.  Denies fevers, change in taste/smell, body aches, SOB, DE, CP, abdominal pain, n/v.  He has been using simple saline nasal spray without much improvement in symptoms.  Was exposed to a few family members last weekend that were sick, but they have  tested negative for COVID.  Has not been vaccinated or tested for COVID.  Patient is currently home Denies any high risk travel to areas of current concern for COVID19. Denies any known or suspected exposure to person with or possibly with COVID19.  Past Medical History:  Diagnosis Date  . Hyperlipidemia   . Hypertension    Social History   Tobacco Use  . Smoking status: Current Every Day Smoker    Packs/day: 1.00    Years: 32.00    Pack years: 32.00    Types: Cigarettes  . Smokeless tobacco: Never Used  Vaping Use  . Vaping Use: Never used  Substance Use Topics  . Alcohol use: Yes    Alcohol/week: 2.0 standard drinks    Types: 2 Standard drinks or equivalent per week  . Drug use: Never    Current Outpatient Medications:  .  B Complex-C-E-Zn (B COMPLEX-C-E-ZINC) tablet, Take 1 tablet by mouth daily. , Disp: , Rfl:  .  Esomeprazole Magnesium (NEXIUM PO), Take 20 mg by mouth daily., Disp: , Rfl:  .  gabapentin (NEURONTIN) 600 MG tablet, TAKE 1 TABLET BY MOUTH EVERY MORNING AND 2 TABLETS EVERY EVENING, Disp: 270 tablet, Rfl: 2 .  guaiFENesin-dextromethorphan (ROBITUSSIN DM) 100-10 MG/5ML syrup, Take 5 mLs by mouth every 4 (four) hours as needed for cough., Disp: , Rfl:  .  ibuprofen (ADVIL,MOTRIN) 800 MG tablet, Take 800 mg by mouth 2 (two) times daily., Disp: , Rfl:  .  lisinopril (ZESTRIL) 10 MG tablet, Take 1 tablet (10 mg total) by mouth daily., Disp: 90 tablet, Rfl: 2 .  rosuvastatin (CRESTOR) 10 MG tablet, Take 1 tablet (10 mg total) by mouth daily., Disp: 90 tablet, Rfl: 2 .  Zinc 100 MG TABS, Take 50 each by mouth., Disp: , Rfl:  .  azelastine (ASTELIN) 0.1 % nasal spray, Place 2 sprays into both nostrils 2 (two) times daily. Use in each nostril as directed, Disp: 30 mL, Rfl: 12 .  benzonatate (TESSALON) 100 MG capsule, Take 1 capsule (100 mg total) by mouth 3 (three) times daily as needed for up to 7 days for cough., Disp: 21 capsule, Rfl: 0 .  diclofenac Sodium  (VOLTAREN) 1 % GEL, Apply 2 g topically 3 (three) times daily as needed (wrist joint pain). (Patient not taking: Reported on 06/30/2020), Disp: 100 g, Rfl: 2 .  guaiFENesin (MUCINEX) 600 MG 12 hr tablet, Take 2 tablets (1,200 mg total) by mouth 2 (two) times daily for 10 days., Disp: 40 tablet, Rfl: 0  Depression screen [facility] 2/9 11/19/2019 10/21/2019 02/17/2019  Decreased Interest 0 0 0  Down, Depressed, Hopeless 0 0 0  PHQ - 2 Score 0 0 0    No flowsheet data found.  -------------------------------------------------------------------------- O: No physical exam performed due to remote telephone encounter.  Physical Exam: Patient remotely monitored with video.  Verbal communication appropriate.  Cognition normal.  No results found for this or any previous visit (from the past 2160 hour(s)).  -------------------------------------------------------------------------- A&P:  Problem List Items Addressed This Visit      Other   Viral infection - Primary    Likely viral infection, due to exposure to sick contacts, can not rule out COVID-19 infection as not tested and not vaccinated.  Has been feeling better, will treat symptomatically.  Plan: 1. Begin azelastine nasal spray as directed 2. Begin mucinex 1200mg  BID x 10 days 3. Can take over the counter coriciden HBP for symptoms, is safe for patients with hypertension 4. Can take tessalon perles as directed 5. RTC if symptoms worsen or fail to progress      Relevant Medications   azelastine (ASTELIN) 0.1 % nasal spray   guaiFENesin (MUCINEX) 600 MG 12 hr tablet   benzonatate (TESSALON) 100 MG capsule      Meds ordered this encounter  Medications  . azelastine (ASTELIN) 0.1 % nasal spray    Sig: Place 2 sprays into both nostrils 2 (two) times daily. Use in each nostril as directed    Dispense:  30 mL    Refill:  12  . guaiFENesin (MUCINEX) 600 MG 12 hr tablet    Sig: Take 2 tablets (1,200 mg total) by mouth 2 (two) times daily for  10 days.    Dispense:  40 tablet    Refill:  0  . benzonatate (TESSALON) 100 MG capsule    Sig: Take 1 capsule (100 mg total) by mouth 3 (three) times daily as needed for up to 7 days for cough.    Dispense:  21 capsule    Refill:  0    Follow-up: - Return if symptoms worsen or fail to improve with current treatment  Patient verbalizes understanding with the above medical recommendations including the limitation of remote medical advice.  Specific follow-up and call-back criteria were given for patient to follow-up or seek medical care more urgently if needed.  - Time spent in direct consultation with patient on video: 6 minutes  , FNP-C [facility] 06/30/2020, 9:29 AM

## 2020-06-30 NOTE — Assessment & Plan Note (Signed)
Likely viral infection, due to exposure to sick contacts, can not rule out COVID-19 infection as not tested and not vaccinated.  Has been feeling better, will treat symptomatically.  Plan: 1. Begin azelastine nasal spray as directed 2. Begin mucinex 1200mg  BID x 10 days 3. Can take over the counter coriciden HBP for symptoms, is safe for patients with hypertension 4. Can take tessalon perles as directed 5. RTC if symptoms worsen or fail to progress

## 2020-07-07 ENCOUNTER — Telehealth: Payer: Self-pay

## 2020-07-07 ENCOUNTER — Other Ambulatory Visit: Payer: Self-pay | Admitting: Family Medicine

## 2020-07-07 DIAGNOSIS — R059 Cough, unspecified: Secondary | ICD-10-CM

## 2020-07-07 MED ORDER — PROMETHAZINE-CODEINE 6.25-10 MG/5ML PO SYRP: 5.0000 mL | ORAL_SOLUTION | Freq: Four times a day (QID) | ORAL | 0 refills | Status: DC | PRN

## 2020-07-07 NOTE — Telephone Encounter (Signed)
The pt was notified that the prescription was sent over to her pharmacy.  °

## 2020-07-07 NOTE — Telephone Encounter (Signed)
Sent in Rx for promethazine DM.

## 2020-07-07 NOTE — Telephone Encounter (Signed)
Copied from CRM [id number]. Topic: General - Other >> Jul 07, 2020  8:08 AM Tamela Oddi wrote: Reason for CRM: Patient called to ask if doctor could prescribe some stronger medication for his symptoms.  He stated that the medication OTC is not working and he needs something stronger  Please call to discuss at [phone]   Chest tightness, SOB, productive cough, more coughing at bedtime  X  2 weeks Pt requesting something to be called in to help with symptoms.

## 2020-08-15 ENCOUNTER — Other Ambulatory Visit: Payer: Self-pay | Admitting: Family Medicine

## 2020-08-15 DIAGNOSIS — I10 Essential (primary) hypertension: Secondary | ICD-10-CM

## 2020-08-15 DIAGNOSIS — E785 Hyperlipidemia, unspecified: Secondary | ICD-10-CM

## 2020-10-13 ENCOUNTER — Ambulatory Visit: Payer: Self-pay | Admitting: *Deleted

## 2020-10-13 NOTE — Telephone Encounter (Signed)
Wife Selena Batten calling to advise their daughter Gil called to advise she tested positive for covid (a rapid test) and she was at their home over the weekend.  Pt is having nausea, hot and cold, and body aches. No fever, cough. She is looking for a way to treat his sx with OTC meds  Call to wife- patient is sleeping at this time- wife states both daughters are testing due to symptoms- 1 is positive and other awaiting results. Patient started having symptoms last night- no vaccine hx. Patient is not having respiratory symptoms at this time and O2 sat is 92-93 (smoker)- advised treat symptoms and appointment for virtual visit made for Tuesday. Advised if symptoms increase/gets worse- call back.    Reason for Disposition . [1] COVID-19 infection suspected by caller or triager AND [2] mild symptoms (cough, fever, or others) AND [3] has not gotten tested yet  Answer Assessment - Initial Assessment Questions 1. COVID-19 DIAGNOSIS: "Who made your COVID-19 diagnosis?" "Was it confirmed by a positive lab test?" If not diagnosed by a HCP, ask "Are there lots of cases (community spread) where you live?" Note: See public health department website, if unsure.     symptoms 2. COVID-19 EXPOSURE: "Was there any known exposure to COVID before the symptoms began?" CDC Definition of close contact: within 6 feet (2 meters) for a total of 15 minutes or more over a 24-hour period.      Home- weekend visit from daughter 64. ONSET: "When did the COVID-19 symptoms start?"      Last night 4. WORST SYMPTOM: "What is your worst symptom?" (e.g., cough, fever, shortness of breath, muscle aches)     Nausea, bodyaches 5. COUGH: "Do you have a cough?" If Yes, ask: "How bad is the cough?"       No cough 6. FEVER: "Do you have a fever?" If Yes, ask: "What is your temperature, how was it measured, and when did it start?"     No fever- chills present 7. RESPIRATORY STATUS: "Describe your breathing?" (e.g., shortness of breath, wheezing,  unable to speak)      No breathing 8. BETTER-SAME-WORSE: "Are you getting better, staying the same or getting worse compared to yesterday?"  If getting worse, ask, "In what way?"    Worse- increasing symptoms 9. HIGH RISK DISEASE: "Do you have any chronic medical problems?" (e.g., asthma, heart or lung disease, weak immune system, obesity, etc.)     Early onset COPD 10. VACCINE: "Have you gotten the COVID-19 vaccine?" If Yes ask: "Which one, how many shots, when did you get it?"       no 11. PREGNANCY: "Is there any chance you are pregnant?" "When was your last menstrual period?"       n/a 12. OTHER SYMPTOMS: "Do you have any other symptoms?"  (e.g., chills, fatigue, headache, loss of smell or taste, muscle pain, sore throat; new loss of smell or taste especially support the diagnosis of COVID-19)       Muscle pain, no appetite  Protocols used: CORONAVIRUS (COVID-19) DIAGNOSED OR SUSPECTED-A-AH

## 2020-10-17 ENCOUNTER — Telehealth: Payer: Self-pay | Admitting: Family Medicine

## 2020-11-16 ENCOUNTER — Other Ambulatory Visit: Payer: Self-pay | Admitting: Family Medicine

## 2020-11-16 DIAGNOSIS — I10 Essential (primary) hypertension: Secondary | ICD-10-CM

## 2020-12-19 ENCOUNTER — Other Ambulatory Visit: Payer: Self-pay | Admitting: Family Medicine

## 2020-12-19 DIAGNOSIS — I10 Essential (primary) hypertension: Secondary | ICD-10-CM

## 2020-12-19 DIAGNOSIS — E785 Hyperlipidemia, unspecified: Secondary | ICD-10-CM

## 2021-03-14 ENCOUNTER — Other Ambulatory Visit: Payer: Self-pay | Admitting: Family Medicine

## 2021-03-14 DIAGNOSIS — I10 Essential (primary) hypertension: Secondary | ICD-10-CM

## 2021-03-14 DIAGNOSIS — E785 Hyperlipidemia, unspecified: Secondary | ICD-10-CM

## 2021-03-14 NOTE — Telephone Encounter (Signed)
Requested medications are due for refill today.  yes  Requested medications are on the active medications list.  yes  Last refill. 12/19/2020  Future visit scheduled.   no  Notes to clinic.  Patient already given courtesy refill. Please advise.

## 2021-03-19 ENCOUNTER — Telehealth: Payer: Self-pay | Admitting: Family Medicine

## 2021-03-19 DIAGNOSIS — E785 Hyperlipidemia, unspecified: Secondary | ICD-10-CM

## 2021-03-19 DIAGNOSIS — I10 Essential (primary) hypertension: Secondary | ICD-10-CM

## 2021-03-19 NOTE — Telephone Encounter (Signed)
Notes to clinic: Attempted to contact patient to schedule appt Vm left for patient to callback     Requested Prescriptions  Pending Prescriptions Disp Refills   rosuvastatin (CRESTOR) 10 MG tablet [Pharmacy Med Name: ROSUVASTATIN 10MG  TABLETS] 30 tablet 0    Sig: TAKE 1 TABLET(10 MG) BY MOUTH DAILY      Cardiovascular:  Antilipid - Statins Failed - 03/19/2021 11:12 AM      Failed - Total Cholesterol in normal range and within 360 days    Cholesterol  Date Value Ref Range Status  11/15/2019 162 <200 mg/dL Final          Failed - LDL in normal range and within 360 days    LDL Cholesterol (Calc)  Date Value Ref Range Status  11/15/2019 94 mg/dL (calc) Final    Comment:    Reference range: <100 . Desirable range <100 mg/dL for primary prevention;   <70 mg/dL for patients with CHD or diabetic patients  with > or = 2 CHD risk factors. 11/17/2019 LDL-C is now calculated using the Martin-Hopkins  calculation, which is a validated novel method providing  better accuracy than the Friedewald equation in the  estimation of LDL-C.  Marland Kitchen et al. Horald Pollen. Lenox Ahr): 2061-2068  (http://education.[facility].com/faq/FAQ164)           Failed - HDL in normal range and within 360 days    HDL  Date Value Ref Range Status  11/15/2019 52 > OR = 40 mg/dL Final          Failed - Triglycerides in normal range and within 360 days    Triglycerides  Date Value Ref Range Status  11/15/2019 74 <150 mg/dL Final          Passed - Patient is not pregnant      Passed - Valid encounter within last 12 months    Recent Outpatient Visits           8 months ago Viral infection   [facility], [facility], FNP   1 year ago Annual physical exam   [facility], DO   1 year ago Smitty Cords, right   [facility] [place], [place], DO   2 years ago Mucous cyst of mouth   [facility]  [place], [place], DO   2 years ago Essential hypertension   [facility], [facility], DO                  lisinopril (ZESTRIL) 10 MG tablet [Pharmacy Med Name: LISINOPRIL 10MG  TABLETS] 30 tablet 0    Sig: TAKE 1 TABLET(10 MG) BY MOUTH DAILY      Cardiovascular:  ACE Inhibitors Failed - 03/19/2021 11:12 AM      Failed - Cr in normal range and within 180 days    Creat  Date Value Ref Range Status  11/15/2019 0.79 0.70 - 1.33 mg/dL Final    Comment:    For patients >89 years of age, the reference limit for Creatinine is approximately 13% higher for people identified as African-American. .           Failed - K in normal range and within 180 days    Potassium  Date Value Ref Range Status  11/15/2019 4.3 3.5 - 5.3 mmol/L Final          Failed - Valid encounter within last 6 months    Recent Outpatient  Visits           8 months ago Viral infection   [facility], Jodelle Gross, FNP   1 year ago Annual physical exam   [facility] Smitty Cords, DO   1 year ago [employer], right   [facility] [place], Netta Neat, DO   2 years ago Mucous cyst of mouth   [facility] [place], Netta Neat, DO   2 years ago Essential hypertension   [facility] Smitty Cords, [place] - Patient is not pregnant      Passed - Last BP in normal range    BP Readings from Last 1 Encounters:  06/30/20 120/77

## 2021-03-20 ENCOUNTER — Other Ambulatory Visit: Payer: Self-pay | Admitting: Family Medicine

## 2021-03-20 DIAGNOSIS — I10 Essential (primary) hypertension: Secondary | ICD-10-CM

## 2021-03-20 DIAGNOSIS — E785 Hyperlipidemia, unspecified: Secondary | ICD-10-CM

## 2021-03-20 NOTE — Telephone Encounter (Signed)
Notes to clinic:  Patient has appointment on 04/13/2021  Review for refill until appt    Requested Prescriptions  Pending Prescriptions Disp Refills   lisinopril (ZESTRIL) 10 MG tablet [Pharmacy Med Name: LISINOPRIL 10MG  TABLETS] 30 tablet 0    Sig: TAKE 1 TABLET(10 MG) BY MOUTH DAILY      Cardiovascular:  ACE Inhibitors Failed - 03/20/2021 12:26 PM      Failed - Cr in normal range and within 180 days    Creat  Date Value Ref Range Status  11/15/2019 0.79 0.70 - 1.33 mg/dL Final    Comment:    For patients >27 years of age, the reference limit for Creatinine is approximately 13% higher for people identified as African-American. .           Failed - K in normal range and within 180 days    Potassium  Date Value Ref Range Status  11/15/2019 4.3 3.5 - 5.3 mmol/L Final          Failed - Valid encounter within last 6 months    Recent Outpatient Visits           8 months ago Viral infection   [facility], [facility], FNP   1 year ago Annual physical exam   [facility], DO   1 year ago Smitty Cords, right   [facility] [place], [place], DO   2 years ago Mucous cyst of mouth   [facility] [place], [place], DO   2 years ago Essential hypertension   [facility] [place], [place], DO       Future Appointments             In 3 weeks Netta Neat, Althea Charon, DO [facility], [facility]             Passed - Patient is not pregnant      Passed - Last BP in normal range    BP Readings from Last 1 Encounters:  06/30/20 120/77            rosuvastatin (CRESTOR) 10 MG tablet [Pharmacy Med Name: ROSUVASTATIN 10MG  TABLETS] 30 tablet 0    Sig: TAKE 1 TABLET(10 MG) BY MOUTH DAILY      Cardiovascular:  Antilipid - Statins Failed - 03/20/2021 12:26 PM      Failed - Total Cholesterol in normal range and within 360  days    Cholesterol  Date Value Ref Range Status  11/15/2019 162 <200 mg/dL Final          Failed - LDL in normal range and within 360 days    LDL Cholesterol (Calc)  Date Value Ref Range Status  11/15/2019 94 mg/dL (calc) Final    Comment:    Reference range: <100 . Desirable range <100 mg/dL for primary prevention;   <70 mg/dL for patients with CHD or diabetic patients  with > or = 2 CHD risk factors. 11/17/2019 LDL-C is now calculated using the Martin-Hopkins  calculation, which is a validated novel method providing  better accuracy than the Friedewald equation in the  estimation of LDL-C.  11/17/2019 et al. Marland Kitchen. Horald Pollen): 2061-2068  (http://education.[facility].com/faq/FAQ164)           Failed - HDL in normal range and within 360 days    HDL  Date Value Ref Range Status  11/15/2019 52 > OR = 40 mg/dL Final  Failed - Triglycerides in normal range and within 360 days    Triglycerides  Date Value Ref Range Status  11/15/2019 74 <150 mg/dL Final          Passed - Patient is not pregnant      Passed - Valid encounter within last 12 months    Recent Outpatient Visits           8 months ago Viral infection   [facility], Jodelle Gross, FNP   1 year ago Annual physical exam   [facility] Smitty Cords, DO   1 year ago [employer], right   [facility], Netta Neat, DO   2 years ago Mucous cyst of mouth   [facility] Smitty Cords, DO   2 years ago Essential hypertension   [facility] Althea Charon, Netta Neat, DO       Future Appointments             In 3 weeks Althea Charon, Netta Neat, DO [facility], [facility]

## 2021-03-20 NOTE — Telephone Encounter (Signed)
Pt called in stating he had talked to someone, about having to have Labs done for a medication refill, and has an appt set for 06/22 at 8:30.

## 2021-03-21 ENCOUNTER — Other Ambulatory Visit: Payer: Self-pay

## 2021-04-09 ENCOUNTER — Other Ambulatory Visit: Payer: Self-pay

## 2021-04-09 DIAGNOSIS — I10 Essential (primary) hypertension: Secondary | ICD-10-CM

## 2021-04-09 DIAGNOSIS — E785 Hyperlipidemia, unspecified: Secondary | ICD-10-CM

## 2021-04-09 DIAGNOSIS — Z0000 Encounter for general adult medical examination without abnormal findings: Secondary | ICD-10-CM

## 2021-04-10 LAB — CBC WITH DIFFERENTIAL/PLATELET
Absolute Monocytes: 455 cells/uL (ref 200–950)
Basophils Absolute: 52 cells/uL (ref 0–200)
Basophils Relative: 0.8 %
Eosinophils Absolute: 657 cells/uL — ABNORMAL HIGH (ref 15–500)
Eosinophils Relative: 10.1 %
HCT: 44.8 % (ref 38.5–50.0)
Hemoglobin: 14.8 g/dL (ref 13.2–17.1)
Lymphs Abs: 2438 cells/uL (ref 850–3900)
MCH: 30.1 pg (ref 27.0–33.0)
MCHC: 33 g/dL (ref 32.0–36.0)
MCV: 91.1 fL (ref 80.0–100.0)
MPV: 10.6 fL (ref 7.5–12.5)
Monocytes Relative: 7 %
Neutro Abs: 2899 cells/uL (ref 1500–7800)
Neutrophils Relative %: 44.6 %
Platelets: 224 10*3/uL (ref 140–400)
RBC: 4.92 10*6/uL (ref 4.20–5.80)
RDW: 12.4 % (ref 11.0–15.0)
Total Lymphocyte: 37.5 %
WBC: 6.5 10*3/uL (ref 3.8–10.8)

## 2021-04-10 LAB — LIPID PANEL
Cholesterol: 192 mg/dL (ref <200)
HDL: 56 mg/dL (ref >=40)
LDL Cholesterol (Calc): 118 mg/dL (calc) — ABNORMAL HIGH
Non-HDL Cholesterol (Calc): 136 mg/dL (calc) — ABNORMAL HIGH (ref <130)
Total CHOL/HDL Ratio: 3.4 (calc) (ref <5.0)
Triglycerides: 82 mg/dL (ref <150)

## 2021-04-10 LAB — HEMOGLOBIN A1C
Hgb A1c MFr Bld: 5.3 % of total Hgb (ref <5.7)
Mean Plasma Glucose: 105 mg/dL
eAG (mmol/L): 5.8 mmol/L

## 2021-04-10 LAB — COMPREHENSIVE METABOLIC PANEL
AG Ratio: 1.9 (calc) (ref 1.0–2.5)
ALT: 23 U/L (ref 9–46)
AST: 20 U/L (ref 10–35)
Albumin: 4.7 g/dL (ref 3.6–5.1)
Alkaline phosphatase (APISO): 61 U/L (ref 35–144)
BUN: 14 mg/dL (ref 7–25)
CO2: 28 mmol/L (ref 20–32)
Calcium: 9.5 mg/dL (ref 8.6–10.3)
Chloride: 104 mmol/L (ref 98–110)
Creat: 0.84 mg/dL (ref 0.70–1.30)
Globulin: 2.5 g/dL (calc) (ref 1.9–3.7)
Glucose, Bld: 94 mg/dL (ref 65–99)
Potassium: 4.8 mmol/L (ref 3.5–5.3)
Sodium: 139 mmol/L (ref 135–146)
Total Bilirubin: 0.7 mg/dL (ref 0.2–1.2)
Total Protein: 7.2 g/dL (ref 6.1–8.1)

## 2021-04-10 LAB — PSA: PSA: 0.47 ng/mL (ref <=4.00)

## 2021-04-13 ENCOUNTER — Other Ambulatory Visit: Payer: Self-pay

## 2021-04-13 ENCOUNTER — Ambulatory Visit (INDEPENDENT_AMBULATORY_CARE_PROVIDER_SITE_OTHER): Payer: 59 | Admitting: Family Medicine

## 2021-04-13 ENCOUNTER — Encounter: Payer: Self-pay | Admitting: Family Medicine

## 2021-04-13 ENCOUNTER — Other Ambulatory Visit: Payer: Self-pay | Admitting: Family Medicine

## 2021-04-13 VITALS — BP 123/87 | HR 77 | Ht 76.0 in | Wt 226.8 lb

## 2021-04-13 DIAGNOSIS — I10 Essential (primary) hypertension: Secondary | ICD-10-CM | POA: Diagnosis not present

## 2021-04-13 DIAGNOSIS — Z0000 Encounter for general adult medical examination without abnormal findings: Secondary | ICD-10-CM

## 2021-04-13 DIAGNOSIS — E785 Hyperlipidemia, unspecified: Secondary | ICD-10-CM

## 2021-04-13 DIAGNOSIS — M5441 Lumbago with sciatica, right side: Secondary | ICD-10-CM

## 2021-04-13 DIAGNOSIS — G8929 Other chronic pain: Secondary | ICD-10-CM

## 2021-04-13 DIAGNOSIS — M5136 Other intervertebral disc degeneration, lumbar region: Secondary | ICD-10-CM | POA: Diagnosis not present

## 2021-04-13 MED ORDER — ROSUVASTATIN CALCIUM 10 MG PO TABS: 10.0000 mg | ORAL_TABLET | Freq: Every day | ORAL | 3 refills | Status: DC

## 2021-04-13 MED ORDER — LISINOPRIL 10 MG PO TABS: 10.0000 mg | ORAL_TABLET | Freq: Every day | ORAL | 3 refills | Status: DC

## 2021-04-13 NOTE — Progress Notes (Signed)
Subjective:    Patient ID: Steven Horn, male    DOB: Jun 06, 1971, 50 y.o.   MRN: [id number]  Steven Horn is a 50 y.o. male presenting on 04/13/2021 for Annual Exam   HPI  Here for Annual Physical and Lab Review.   CHRONIC HTN: Reports no concerns with BP currently. Checks BP occasionally Current Meds - Lisinopril 10mg  daily Reports good compliance, took meds today. Tolerating well, w/o complaints. Denies CP, dyspnea, HA, edema, dizziness / lightheadedness   HYPERLIPIDEMIA / BMI >27 - Reports no concerns currently but he does have early family history of MI / CAD on maternal side - Currently taking Rosuvastatin 10mg  daily, tolerating well without side effects or myalgias - asking if he should continue med Lifestyle - Diet: Improved diet, healthier options, not eating out currently, drinks water, some other low sugar drinks. - Exercise: active with work physical job, no regular exercise - Maternal family history uncle passed age 92 MI   Chronic Low Back Pain, DDD Lumbar Spine / History of OA/DJD in other joints (hands R > L) Reports no prior actual back injury, >15+ years of this issue, seems to be gradual - Previously had seen [facility], and then eventually went to Ortho / Pain for repeat MRI, and he has received ESI in past with limited. - Taking Gabapentin 600mg  usually 1 tab twice a day now - Taking Ibuprofen 800mg  BID and Tylenol 325mg  BID, few weeks at a time for flares     Additional concerns  Chronic R Thumb Pain Reports chronic R stinging pain in thumb joint, last seen 10/2019   Dizziness episodic  R Foot Numbness 3 in between toes have had persistent numbness tingling  Health Maintenance:  Prostate CA Screening: Last PSA 0.47 (2022). Currently asymptomatic without BPH LUTS. No known family history of prostate CA.    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   L ring finger, distal infection due to strep, req debridement then amputation, [facility]   Social History   Socioeconomic History   Marital status: Married    Spouse name: Not on file   Number of children: Not on file   Years of education: High School   Highest education level: High school graduate  Occupational History   Occupation: [employer]  Tobacco Use   Smoking status: Every Day    Packs/day: 1.00    Years: 32.00    Pack years: 32.00    Types: Cigarettes   Smokeless tobacco: Never  Vaping Use   Vaping Use: Never used  Substance and Sexual Activity   Alcohol use: Yes    Alcohol/week: 2.0 standard drinks    Types: 2 Standard drinks or equivalent per week   Drug use: Never   Sexual activity: Not on file  Other Topics Concern   Not on file  Social  History Narrative   Not on file   Social Determinants of Health   Financial Resource Strain: Not on file  Food Insecurity: Not on file  Transportation Needs: Not on file  Physical Activity: Not on file  Stress: Not on file  Social Connections: Not on file  Intimate Partner Violence: Not on file   Family History  Problem Relation Age of Onset   Diabetes Mother    Hyperlipidemia Mother    Depression Father    Alcohol abuse Father    Hyperlipidemia Father    Hypertension Father    Hyperlipidemia Brother    Lung cancer Maternal Grandmother 5   Heart attack Maternal Grandfather 44   Heart attack Maternal Uncle 44   Colon cancer Neg Hx    Current Outpatient Medications on File Prior to Visit   Medication Sig   azelastine (ASTELIN) 0.1 % nasal spray Place 2 sprays into both nostrils 2 (two) times daily. Use in each nostril as directed   B Complex-C-E-Zn (B COMPLEX-C-E-ZINC) tablet Take 1 tablet by mouth daily.    Esomeprazole Magnesium (NEXIUM PO) Take 20 mg by mouth daily.   gabapentin (NEURONTIN) 600 MG tablet TAKE 1 TABLET BY MOUTH EVERY MORNING AND 2 TABLETS EVERY EVENING   ibuprofen (ADVIL,MOTRIN) 800 MG tablet Take 800 mg by mouth 2 (two) times daily.   Zinc 100 MG TABS Take 50 each by mouth.   diclofenac Sodium (VOLTAREN) 1 % GEL Apply 2 g topically 3 (three) times daily as needed (wrist joint pain). (Patient not taking: No sig reported)   No current facility-administered medications on file prior to visit.    Review of Systems  Constitutional:  Negative for activity change, appetite change, chills, diaphoresis, fatigue and fever.  HENT:  Negative for congestion and hearing loss.   Eyes:  Negative for visual disturbance.  Respiratory:  Negative for cough, chest tightness, shortness of breath and wheezing.   Cardiovascular:  Negative for chest pain, palpitations and leg swelling.  Gastrointestinal:  Negative for abdominal pain, constipation, diarrhea, nausea and vomiting.  Genitourinary:  Negative for dysuria, frequency and hematuria.  Musculoskeletal:  Negative for arthralgias and neck pain.  Skin:  Negative for rash.  Neurological:  Negative for dizziness, weakness, light-headedness, numbness and headaches.  Hematological:  Negative for adenopathy.  Psychiatric/Behavioral:  Negative for behavioral problems, dysphoric mood and sleep disturbance.   Per HPI unless specifically indicated above      Objective:    BP 123/87   Pulse 77   Ht 6\' 4"  (1.93 m)   Wt 226 lb 12.8 oz (102.9 kg)   SpO2 100%   BMI 27.61 kg/m   Wt Readings from Last 3 Encounters:  04/13/21 226 lb 12.8 oz (102.9 kg)  11/19/19 211 lb (95.7 kg)  10/21/19 211 lb (95.7 kg)    Physical  Exam Vitals and nursing note reviewed.  Constitutional:      General: He is not in acute distress.    Appearance: He is well-developed. He is not diaphoretic.     Comments: Well-appearing, comfortable, cooperative  HENT:     Head: Normocephalic and atraumatic.  Eyes:     General:        Right eye: No discharge.        Left eye: No discharge.     Conjunctiva/sclera: Conjunctivae normal.     Pupils: Pupils are equal, round, and reactive to light.  Neck:     Thyroid: No thyromegaly.  Cardiovascular:  Rate and Rhythm: Normal rate and regular rhythm.     Pulses: Normal pulses.     Heart sounds: Normal heart sounds. No murmur heard. Pulmonary:     Effort: Pulmonary effort is normal. No respiratory distress.     Breath sounds: Normal breath sounds. No wheezing or rales.  Abdominal:     General: Bowel sounds are normal. There is no distension.     Palpations: Abdomen is soft. There is no mass.     Tenderness: There is no abdominal tenderness.  Musculoskeletal:        General: No tenderness. Normal range of motion.     Cervical back: Normal range of motion and neck supple.     Comments: Upper / Lower Extremities: - Normal muscle tone, strength bilateral upper extremities 5/5, lower extremities 5/5  Lymphadenopathy:     Cervical: No cervical adenopathy.  Skin:    General: Skin is warm and dry.     Findings: No erythema or rash.  Neurological:     Mental Status: He is alert and oriented to person, place, and time.     Comments: Distal sensation intact to light touch all extremities  Psychiatric:        Mood and Affect: Mood normal.        Behavior: Behavior normal.        Thought Content: Thought content normal.     Comments: Well groomed, good eye contact, normal speech and thoughts     Results for orders placed or performed in visit on 04/09/21  HgB A1c  Result Value Ref Range   Hgb A1c MFr Bld 5.3 <5.7 % of total Hgb   Mean Plasma Glucose 105 mg/dL   eAG (mmol/L) 5.8  mmol/L  CBC with Differential  Result Value Ref Range   WBC 6.5 3.8 - 10.8 Thousand/uL   RBC 4.92 4.20 - 5.80 Million/uL   Hemoglobin 14.8 13.2 - 17.1 g/dL   HCT 14.4 31.5 - 40.0 %   MCV 91.1 80.0 - 100.0 fL   MCH 30.1 27.0 - 33.0 pg   MCHC 33.0 32.0 - 36.0 g/dL   RDW 86.7 61.9 - 50.9 %   Platelets 224 140 - 400 Thousand/uL   MPV 10.6 7.5 - 12.5 fL   Neutro Abs 2,899 1,500 - 7,800 cells/uL   Lymphs Abs 2,438 850 - 3,900 cells/uL   Absolute Monocytes 455 200 - 950 cells/uL   Eosinophils Absolute 657 (H) 15 - 500 cells/uL   Basophils Absolute 52 0 - 200 cells/uL   Neutrophils Relative % 44.6 %   Total Lymphocyte 37.5 %   Monocytes Relative 7.0 %   Eosinophils Relative 10.1 %   Basophils Relative 0.8 %  Comprehensive Metabolic Panel (CMET)  Result Value Ref Range   Glucose, Bld 94 65 - 99 mg/dL   BUN 14 7 - 25 mg/dL   Creat 3.26 7.12 - 4.58 mg/dL   BUN/Creatinine Ratio NOT APPLICABLE 6 - 22 (calc)   Sodium 139 135 - 146 mmol/L   Potassium 4.8 3.5 - 5.3 mmol/L   Chloride 104 98 - 110 mmol/L   CO2 28 20 - 32 mmol/L   Calcium 9.5 8.6 - 10.3 mg/dL   Total Protein 7.2 6.1 - 8.1 g/dL   Albumin 4.7 3.6 - 5.1 g/dL   Globulin 2.5 1.9 - 3.7 g/dL (calc)   AG Ratio 1.9 1.0 - 2.5 (calc)   Total Bilirubin 0.7 0.2 - 1.2 mg/dL   Alkaline phosphatase (APISO) 61  35 - 144 U/L   AST 20 10 - 35 U/L   ALT 23 9 - 46 U/L  Lipid panel  Result Value Ref Range   Cholesterol 192 <200 mg/dL   HDL 56 > OR = 40 mg/dL   Triglycerides 82 <409<150 mg/dL   LDL Cholesterol (Calc) 118 (H) mg/dL (calc)   Total CHOL/HDL Ratio 3.4 <5.0 (calc)   Non-HDL Cholesterol (Calc) 136 (H) <130 mg/dL (calc)  PSA  Result Value Ref Range   PSA 0.47 < OR = 4.00 ng/mL      Assessment & Plan:   Problem List Items Addressed This Visit     Hyperlipidemia    Controlled cholesterol on statin and lifestyle  Plan: 1. Continue current meds - Rosuvastatin 10mg  daily, tolerating well, advise to keep for ASCVD risk reduction  w/ his high risk fam history heart disease 2. Encourage improved lifestyle - low carb/cholesterol, reduce portion size, continue improving regular exercise       Relevant Medications   rosuvastatin (CRESTOR) 10 MG tablet   lisinopril (ZESTRIL) 10 MG tablet   Essential hypertension    Controlled HTN No known complications   Plan:  1. Continue current BP regimen Lisinopril 10mg  daily re order 2. Encourage improved lifestyle - low sodium diet 3. Continue to monitor BP outside office, bring readings to next visit, if persistently >140/90 or new symptoms notify office sooner       Relevant Medications   rosuvastatin (CRESTOR) 10 MG tablet   lisinopril (ZESTRIL) 10 MG tablet   DDD (degenerative disc disease), lumbar   Chronic bilateral low back pain with right-sided sciatica   Other Visit Diagnoses     Annual physical exam    -  Primary       Updated Health Maintenance information Reviewed recent lab results with patient Encouraged improvement to lifestyle with diet and exercise Goal of weight loss  #Neuro symptoms Multiple issues additionally today with R thumb sharp pain and L toe foot numbness and dizziness episodes, we discussed may warrant neurology referral if/when ready to pursue at follow-up. Can notify office and we can consider referral if unresolved. - Considered arthritis for R thumb pain but history and exam not consistent. - He has known lower back spinal condition with Lumbar spinal DDD   Meds ordered this encounter  Medications   rosuvastatin (CRESTOR) 10 MG tablet    Sig: Take 1 tablet (10 mg total) by mouth at bedtime.    Dispense:  90 tablet    Refill:  3   lisinopril (ZESTRIL) 10 MG tablet    Sig: Take 1 tablet (10 mg total) by mouth daily.    Dispense:  90 tablet    Refill:  3     Follow up plan: Return in about 1 year (around 04/13/2022) for 1 year fasting lab only then 1 week later Annual Physical.   Saralyn PilarAlexander Cassidey Barrales, DO [facility] 04/13/2021, 2:02 PM

## 2021-04-13 NOTE — Telephone Encounter (Signed)
Notes to clinic:  Patient has appt today    Requested Prescriptions  Pending Prescriptions Disp Refills   lisinopril (ZESTRIL) 10 MG tablet [Pharmacy Med Name: LISINOPRIL 10MG  TABLETS] 30 tablet 0    Sig: TAKE 1 TABLET(10 MG) BY MOUTH DAILY      Cardiovascular:  ACE Inhibitors Failed - 04/13/2021  6:22 AM      Failed - Valid encounter within last 6 months    Recent Outpatient Visits           9 months ago Viral infection   [facility], [facility], FNP   1 year ago Annual physical exam   [facility], DO   1 year ago Smitty Cords, right   [facility], [place], DO   2 years ago Mucous cyst of mouth   [facility], [place], DO   2 years ago Essential hypertension   [facility], [place], DO       Future Appointments             Today Netta Neat, Althea Charon, DO [facility], [facility]             Passed - Cr in normal range and within 180 days    Creat  Date Value Ref Range Status  04/09/2021 0.84 0.70 - 1.30 mg/dL Final          Passed - K in normal range and within 180 days    Potassium  Date Value Ref Range Status  04/09/2021 4.8 3.5 - 5.3 mmol/L Final          Passed - Patient is not pregnant      Passed - Last BP in normal range    BP Readings from Last 1 Encounters:  06/30/20 120/77            rosuvastatin (CRESTOR) 10 MG tablet [Pharmacy Med Name: ROSUVASTATIN 10MG  TABLETS] 30 tablet 0    Sig: TAKE 1 TABLET(10 MG) BY MOUTH DAILY      Cardiovascular:  Antilipid - Statins Failed - 04/13/2021  6:22 AM      Failed - LDL in normal range and within 360 days    LDL Cholesterol (Calc)  Date Value Ref Range Status  04/09/2021 118 (H) mg/dL (calc) Final    Comment:    Reference range: <100 . Desirable range <100 mg/dL for primary prevention;   <70 mg/dL for patients with  CHD or diabetic patients  with > or = 2 CHD risk factors. 04/15/2021 LDL-C is now calculated using the Martin-Hopkins  calculation, which is a validated novel method providing  better accuracy than the Friedewald equation in the  estimation of LDL-C.  06/10/2021 et al. Marland Kitchen. Horald Pollen): 2061-2068  (http://education.[facility].com/faq/FAQ164)           Passed - Total Cholesterol in normal range and within 360 days    Cholesterol  Date Value Ref Range Status  04/09/2021 192 <200 mg/dL Final          Passed - HDL in normal range and within 360 days    HDL  Date Value Ref Range Status  04/09/2021 56 > OR = 40 mg/dL Final          Passed - Triglycerides in normal range and within 360 days    Triglycerides  Date Value Ref Range Status  04/09/2021 82 <150 mg/dL Final  Passed - Patient is not pregnant      Passed - Valid encounter within last 12 months    Recent Outpatient Visits           9 months ago Viral infection   [facility], Jodelle Gross, FNP   1 year ago Annual physical exam   [facility] Smitty Cords, DO   1 year ago [employer], right   [facility], Netta Neat, DO   2 years ago Mucous cyst of mouth   [facility] Smitty Cords, DO   2 years ago Essential hypertension   [facility] [place], Netta Neat, DO       Future Appointments             Today Althea Charon Netta Neat, DO [facility], [facility]

## 2021-04-13 NOTE — Patient Instructions (Addendum)
Thank you for coming to the office today.  As we discussed, these symptoms with the thumb and toe numbness and pain and dizziness could be neurologically related. Please let me know do not improve and we can refer you to a Neurologist.  [facility] - Neurology Dept [address] [place], [place] [address] Phone: [phone]  Leg cramps - Try spoonful of yellow mustard to relieve leg cramps or try daily to prevent the problem  - OTC natural option is Hyland's Leg Cramps (Dissolving tablet) take as needed for muscle cramps  DUE for FASTING BLOOD WORK (no food or drink after midnight before the lab appointment, only water or coffee without cream/sugar on the morning of)  SCHEDULE "Lab Only" visit in the morning at the clinic for lab draw in 1 YEAR  - Make sure Lab Only appointment is at about 1 week before your next appointment, so that results will be available  For Lab Results, once available within 2-3 days of blood draw, you can can log in to MyChart online to view your results and a brief explanation. Also, we can discuss results at next follow-up visit.   Please schedule a Follow-up Appointment to: Return in about 1 year (around 04/13/2022) for 1 year fasting lab only then 1 week later Annual Physical.  If you have any other questions or concerns, please feel free to call the office or send a message through MyChart. You may also schedule an earlier appointment if necessary.  Additionally, you may be receiving a survey about your experience at our office within a few days to 1 week by e-mail or mail. We value your feedback.  Saralyn Pilar, DO [facility], [place]

## 2021-04-14 NOTE — Assessment & Plan Note (Addendum)
Controlled cholesterol on statin and lifestyle  Plan: 1. Continue current meds - Rosuvastatin 10mg daily, tolerating well, advise to keep for ASCVD risk reduction w/ his high risk fam history heart disease 2. Encourage improved lifestyle - low carb/cholesterol, reduce portion size, continue improving regular exercise 

## 2021-04-14 NOTE — Assessment & Plan Note (Signed)
Controlled HTN No known complications   Plan:  1. Continue current BP regimen Lisinopril 10mg daily re order 2. Encourage improved lifestyle - low sodium diet 3. Continue to monitor BP outside office, bring readings to next visit, if persistently >140/90 or new symptoms notify office sooner 

## 2021-06-12 ENCOUNTER — Other Ambulatory Visit: Payer: Self-pay | Admitting: Family Medicine

## 2021-06-12 DIAGNOSIS — G8929 Other chronic pain: Secondary | ICD-10-CM

## 2021-06-12 DIAGNOSIS — M5136 Other intervertebral disc degeneration, lumbar region: Secondary | ICD-10-CM

## 2021-11-29 ENCOUNTER — Other Ambulatory Visit: Payer: Self-pay | Admitting: Family Medicine

## 2021-11-29 DIAGNOSIS — E785 Hyperlipidemia, unspecified: Secondary | ICD-10-CM

## 2021-11-29 NOTE — Telephone Encounter (Signed)
Spoke with pharmacy, pt has multiple refills available. Filled today ? ?Requested Prescriptions  ?Pending Prescriptions Disp Refills  ?? rosuvastatin (CRESTOR) 10 MG tablet [Pharmacy Med Name: ROSUVASTATIN CALCIUM 10 MG TAB] 30 tablet 0  ?  Sig: TAKE ONE TABLET BY MOUTH AT BEDTIME.  ?  ? Cardiovascular:  Antilipid - Statins 2 Failed - 11/29/2021 11:25 AM  ?  ?  Failed - Lipid Panel in normal range within the last 12 months  ?  Cholesterol  ?Date Value Ref Range Status  ?04/09/2021 192 <200 mg/dL Final  ? ?LDL Cholesterol (Calc)  ?Date Value Ref Range Status  ?04/09/2021 118 (H) mg/dL (calc) Final  ?  Comment:  ?  Reference range: <100 ?Marland Kitchen ?Desirable range <100 mg/dL for primary prevention;   ?<70 mg/dL for patients with CHD or diabetic patients  ?with > or = 2 CHD risk factors. ?. ?LDL-C is now calculated using the Martin-Hopkins  ?calculation, which is a validated novel method providing  ?better accuracy than the Friedewald equation in the  ?estimation of LDL-C.  ?Horald Pollen et al. Lenox Ahr. 7425;956(38): 2061-2068  ?(http://education.[facility].com/faq/FAQ164) ?  ? ?HDL  ?Date Value Ref Range Status  ?04/09/2021 56 > OR = 40 mg/dL Final  ? ?Triglycerides  ?Date Value Ref Range Status  ?04/09/2021 82 <150 mg/dL Final  ? ?  ?  ?  Passed - Cr in normal range and within 360 days  ?  Creat  ?Date Value Ref Range Status  ?04/09/2021 0.84 0.70 - 1.30 mg/dL Final  ?   ?  ?  Passed - Patient is not pregnant  ?  ?  Passed - Valid encounter within last 12 months  ?  Recent Outpatient Visits   ?      ? 7 months ago Annual physical exam  ? [facility] Smitty Cords, DO  ? 1 year ago Viral infection  ? [facility], Jodelle Gross, FNP  ? 2 years ago Annual physical exam  ? [facility] [place], Netta Neat, DO  ? 2 years ago [employer], right  ? [facility] [place], Netta Neat, DO  ? 2 years ago Mucous cyst of mouth  ? [facility] Smitty Cords, DO  ?  ?  ? ?  ?  ?  ? ? ?

## 2022-03-11 ENCOUNTER — Other Ambulatory Visit: Payer: Self-pay | Admitting: Family Medicine

## 2022-03-11 DIAGNOSIS — I10 Essential (primary) hypertension: Secondary | ICD-10-CM

## 2022-03-12 ENCOUNTER — Telehealth: Payer: Self-pay

## 2022-03-12 ENCOUNTER — Other Ambulatory Visit: Payer: Self-pay | Admitting: Family Medicine

## 2022-03-12 DIAGNOSIS — M5136 Other intervertebral disc degeneration, lumbar region: Secondary | ICD-10-CM

## 2022-03-12 DIAGNOSIS — I10 Essential (primary) hypertension: Secondary | ICD-10-CM

## 2022-03-12 DIAGNOSIS — Z125 Encounter for screening for malignant neoplasm of prostate: Secondary | ICD-10-CM

## 2022-03-12 DIAGNOSIS — Z0000 Encounter for general adult medical examination without abnormal findings: Secondary | ICD-10-CM

## 2022-03-12 DIAGNOSIS — E785 Hyperlipidemia, unspecified: Secondary | ICD-10-CM

## 2022-03-12 NOTE — Telephone Encounter (Signed)
Please notify patient  Fasting Labs are ordered. He can schedule lab only apt 1 week prior to his apt in July  Saralyn Pilar, DO [facility] 03/12/2022, 11:58 AM

## 2022-03-12 NOTE — Telephone Encounter (Signed)
Keep up coming appointment. Requested Prescriptions  Pending Prescriptions Disp Refills  . lisinopril (ZESTRIL) 10 MG tablet [Pharmacy Med Name: LISINOPRIL 10 MG TABLET] 30 tablet 1    Sig: TAKE 1 TABLET BY MOUTH ONCE DAILY.     Cardiovascular:  ACE Inhibitors Failed - 03/11/2022 10:54 AM      Failed - Cr in normal range and within 180 days    Creat  Date Value Ref Range Status  04/09/2021 0.84 0.70 - 1.30 mg/dL Final         Failed - K in normal range and within 180 days    Potassium  Date Value Ref Range Status  04/09/2021 4.8 3.5 - 5.3 mmol/L Final         Failed - Valid encounter within last 6 months    Recent Outpatient Visits          11 months ago Annual physical exam   [place], DO   1 year ago Viral infection   [facility], [place], [place]   2 years ago Annual physical exam   [facility] Olin Hauser, DO   2 years ago [employer], right   [place], DO   3 years ago Mucous cyst of mouth   [place], DO      Future Appointments            In 1 month [facility], [place] - Patient is not pregnant      Passed - Last BP in normal range    BP Readings from Last 1 Encounters:  04/13/21 123/87

## 2022-03-12 NOTE — Telephone Encounter (Signed)
Copied from CRM [id number]. Topic: Appointment Scheduling - Scheduling Inquiry for Clinic >> Mar 11, 2022  9:59 AM Steven Horn wrote: Reason for CRM: pt would like order to have his labs drawn a week prior to his cpe >> Mar 11, 2022 11:14 AM Danella Maiers wrote: Please notify patient when completed

## 2022-03-15 NOTE — Telephone Encounter (Signed)
I left him a message letting him know he can schedule a lab appt. He can call back and schedule at his convenience.

## 2022-04-19 ENCOUNTER — Other Ambulatory Visit: Payer: Self-pay

## 2022-04-19 DIAGNOSIS — Z0000 Encounter for general adult medical examination without abnormal findings: Secondary | ICD-10-CM

## 2022-04-19 DIAGNOSIS — I10 Essential (primary) hypertension: Secondary | ICD-10-CM | POA: Diagnosis not present

## 2022-04-19 DIAGNOSIS — Z125 Encounter for screening for malignant neoplasm of prostate: Secondary | ICD-10-CM | POA: Diagnosis not present

## 2022-04-19 DIAGNOSIS — M5136 Other intervertebral disc degeneration, lumbar region: Secondary | ICD-10-CM

## 2022-04-19 DIAGNOSIS — E785 Hyperlipidemia, unspecified: Secondary | ICD-10-CM | POA: Diagnosis not present

## 2022-04-20 LAB — CBC WITH DIFFERENTIAL/PLATELET
Absolute Monocytes: 441 cells/uL (ref 200–950)
Basophils Absolute: 52 cells/uL (ref 0–200)
Basophils Relative: 0.9 %
Eosinophils Absolute: 522 cells/uL — ABNORMAL HIGH (ref 15–500)
Eosinophils Relative: 9 %
HCT: 44.4 % (ref 38.5–50.0)
Hemoglobin: 14.7 g/dL (ref 13.2–17.1)
Lymphs Abs: 2250 cells/uL (ref 850–3900)
MCH: 29.7 pg (ref 27.0–33.0)
MCHC: 33.1 g/dL (ref 32.0–36.0)
MCV: 89.7 fL (ref 80.0–100.0)
MPV: 10.4 fL (ref 7.5–12.5)
Monocytes Relative: 7.6 %
Neutro Abs: 2535 cells/uL (ref 1500–7800)
Neutrophils Relative %: 43.7 %
Platelets: 209 10*3/uL (ref 140–400)
RBC: 4.95 10*6/uL (ref 4.20–5.80)
RDW: 12.8 % (ref 11.0–15.0)
Total Lymphocyte: 38.8 %
WBC: 5.8 10*3/uL (ref 3.8–10.8)

## 2022-04-20 LAB — COMPLETE METABOLIC PANEL WITH GFR
AG Ratio: 1.8 (calc) (ref 1.0–2.5)
ALT: 16 U/L (ref 9–46)
AST: 17 U/L (ref 10–35)
Albumin: 4.6 g/dL (ref 3.6–5.1)
Alkaline phosphatase (APISO): 62 U/L (ref 35–144)
BUN: 12 mg/dL (ref 7–25)
CO2: 26 mmol/L (ref 20–32)
Calcium: 9.5 mg/dL (ref 8.6–10.3)
Chloride: 104 mmol/L (ref 98–110)
Creat: 0.88 mg/dL (ref 0.70–1.30)
Globulin: 2.5 g/dL (calc) (ref 1.9–3.7)
Glucose, Bld: 92 mg/dL (ref 65–99)
Potassium: 4.3 mmol/L (ref 3.5–5.3)
Sodium: 140 mmol/L (ref 135–146)
Total Bilirubin: 0.7 mg/dL (ref 0.2–1.2)
Total Protein: 7.1 g/dL (ref 6.1–8.1)
eGFR: 103 mL/min/{1.73_m2} (ref >=60)

## 2022-04-20 LAB — LIPID PANEL
Cholesterol: 164 mg/dL (ref <200)
HDL: 48 mg/dL (ref >=40)
LDL Cholesterol (Calc): 99 mg/dL (calc)
Non-HDL Cholesterol (Calc): 116 mg/dL (calc) (ref <130)
Total CHOL/HDL Ratio: 3.4 (calc) (ref <5.0)
Triglycerides: 82 mg/dL (ref <150)

## 2022-04-20 LAB — HEMOGLOBIN A1C
Hgb A1c MFr Bld: 5.2 % of total Hgb (ref <5.7)
Mean Plasma Glucose: 103 mg/dL
eAG (mmol/L): 5.7 mmol/L

## 2022-04-20 LAB — PSA: PSA: 0.52 ng/mL (ref <=4.00)

## 2022-04-24 ENCOUNTER — Ambulatory Visit (INDEPENDENT_AMBULATORY_CARE_PROVIDER_SITE_OTHER): Payer: 59 | Admitting: Family Medicine

## 2022-04-24 ENCOUNTER — Encounter: Payer: Self-pay | Admitting: Family Medicine

## 2022-04-24 VITALS — BP 116/74 | HR 68 | Ht 76.0 in | Wt 227.2 lb

## 2022-04-24 DIAGNOSIS — M5441 Lumbago with sciatica, right side: Secondary | ICD-10-CM

## 2022-04-24 DIAGNOSIS — M5136 Other intervertebral disc degeneration, lumbar region: Secondary | ICD-10-CM | POA: Diagnosis not present

## 2022-04-24 DIAGNOSIS — I10 Essential (primary) hypertension: Secondary | ICD-10-CM | POA: Diagnosis not present

## 2022-04-24 DIAGNOSIS — G8929 Other chronic pain: Secondary | ICD-10-CM | POA: Diagnosis not present

## 2022-04-24 DIAGNOSIS — E785 Hyperlipidemia, unspecified: Secondary | ICD-10-CM | POA: Diagnosis not present

## 2022-04-24 DIAGNOSIS — R69 Illness, unspecified: Secondary | ICD-10-CM | POA: Diagnosis not present

## 2022-04-24 DIAGNOSIS — F17200 Nicotine dependence, unspecified, uncomplicated: Secondary | ICD-10-CM

## 2022-04-24 DIAGNOSIS — E663 Overweight: Secondary | ICD-10-CM | POA: Diagnosis not present

## 2022-04-24 DIAGNOSIS — Z0000 Encounter for general adult medical examination without abnormal findings: Secondary | ICD-10-CM

## 2022-04-24 MED ORDER — LISINOPRIL 10 MG PO TABS: 10.0000 mg | ORAL_TABLET | Freq: Every day | ORAL | 3 refills | Status: DC

## 2022-04-24 MED ORDER — VARENICLINE TARTRATE 0.5 MG X 11 & 1 MG X 42 PO TBPK: ORAL_TABLET | ORAL | 0 refills | Status: DC

## 2022-04-24 MED ORDER — ROSUVASTATIN CALCIUM 10 MG PO TABS: 10.0000 mg | ORAL_TABLET | Freq: Every day | ORAL | 3 refills | Status: DC

## 2022-04-24 MED ORDER — GABAPENTIN 300 MG PO CAPS: 300.0000 mg | ORAL_CAPSULE | Freq: Two times a day (BID) | ORAL | 3 refills | Status: DC

## 2022-04-24 NOTE — Assessment & Plan Note (Signed)
Controlled cholesterol on statin and lifestyle  Plan: 1. Continue current meds - Rosuvastatin 10mg  daily, tolerating well, advise to keep for ASCVD risk reduction w/ his high risk fam history heart disease 2. Encourage improved lifestyle - low carb/cholesterol, reduce portion size, continue improving regular exercise

## 2022-04-24 NOTE — Assessment & Plan Note (Addendum)
Stable chronic problem Lumbar DDD, Osteoarthritis DJD Chronic pain generator causing symptoms See HPI for past history  Will adjust gabapentin since unsure if effective Reduced Gabapentin from 600 to 300mg  capsules, follow up if this is not helping we can figure out other options.

## 2022-04-24 NOTE — Assessment & Plan Note (Signed)
Controlled HTN No known complications   Plan:  1. Continue current BP regimen Lisinopril 10mg  daily re order 2. Encourage improved lifestyle - low sodium diet 3. Continue to monitor BP outside office, bring readings to next visit, if persistently >140/90 or new symptoms notify office sooner

## 2022-04-24 NOTE — Patient Instructions (Addendum)
Thank you for coming to the office today.  Reduced Gabapentin from 600 to 300mg  capsules, follow up if this is not helping we can figure out other options.  Other meds re ordered.  Labs are good overall.  Shingles shot if interested.  Cologuard Summer 2024.   CT Lung Screening for cancer, age 53+ smoker, can get this approved by insurance. Should be free.  Coronary Calcium Score / CT including Heart Pictures - this would be $100 fee from [facility].  Let me know!   Varenicline - Chantix Dosing: Duration Dosage  Initial 3 days  0.5mg  daily  Days 4-7 0.5mg  twice daily  Day 8 through the end of therapy  1mg  twice daily   Prescribing Instructions: Use of "Chantix - Starting Month Pak" and "Chantix - Continuing Month Pak" provide easy to use blister packaging.  Black Box Warning: Mood Change -"Serious neuropsychiatric events have been reported." Stop Drug and contact health care provider if patient. "agitation, hostility, depressed mood or changes in thinking or behavior that are not typical for the patient are observed, or if the patient develops suicidal ideation / behavior." Document potential mood change.  Patients can continue to smoke while initiating varenicline. A "target quit date" should be set on calendar approximately Day 8 to max Day 30 (of the treatment) - PREFERRED QUIT DATE - 1 to 2 weeks from start of medicine. Advise to take this medication WITH food.  Minimizing the nausea (typically mild and transient).  If nausea occurs, consider dose reduction or temporary cessation of treatment.   The treatment should be continued for 12 weeks.  An additional 12 weeks of therapy can be used at the prescribers discretion.   Chantix can change the way people react to alcohol (decreased tolerance, increased drunkenness, unusual or aggressive behavior, memory loss) Seizures occurred in patients that had no history of seizures and in patients that had a seizure disorder that had been  well controlled  Discuss Cardiovascular Safety   DUE for FASTING BLOOD WORK (no food or drink after midnight before the lab appointment, only water or coffee without cream/sugar on the morning of)  SCHEDULE "Lab Only" visit in the morning at the clinic for lab draw in 1 YEAR  - Make sure Lab Only appointment is at about 1 week before your next appointment, so that results will be available  For Lab Results, once available within 2-3 days of blood draw, you can can log in to MyChart online to view your results and a brief explanation. Also, we can discuss results at next follow-up visit.   Please schedule a Follow-up Appointment to: Return in about 1 year (around 04/25/2023) for 1 year fasting lab only then 1 week later Annual Physical.  If you have any other questions or concerns, please feel free to call the office or send a message through MyChart. You may also schedule an earlier appointment if necessary.  Additionally, you may be receiving a survey about your experience at our office within a few days to 1 week by e-mail or mail. We value your feedback.  , DO [facility], Saralyn Pilar

## 2022-04-24 NOTE — Progress Notes (Signed)
Subjective:    Patient ID: Steven Horn, male    DOB: 10-30-1970, 51 y.o.   MRN: [id number]  Steven Horn is a 51 y.o. male presenting on 04/24/2022 for Annual Exam   HPI  Here for Annual Physical and Lab Review.  He is updating ins to [place] now, has new card today.    CHRONIC HTN: Reports no concerns with BP currently. Checks BP occasionally Current Meds - Lisinopril 10mg  daily Reports good compliance, took meds today. Tolerating well, w/o complaints. Denies CP, dyspnea, HA, edema, dizziness / lightheadedness   HYPERLIPIDEMIA / BMI >27 - Reports no concerns currently but he does have early family history of MI / CAD on maternal side - Currently taking Rosuvastatin 10mg  daily, tolerating well without side effects or myalgias - asking if he should continue med Lifestyle - Diet: Improved diet, healthier options, not eating out currently, drinks water, some other low sugar drinks. - Exercise: active with work physical job, no regular exercise - Maternal family history uncle passed age 63 MI   Chronic Low Back Pain, DDD Lumbar Spine / History of OA/DJD in other joints (hands R > L) Reports no prior actual back injury, >15+ years of this issue, seems to be gradual - Previously had seen [facility], and then eventually went to Ortho / Pain for repeat MRI, and he has received ESI in past with limited. - Taking Gabapentin 600mg  usually 1 tab twice a day now - he prefers to reduce to see if still helping. - Taking Ibuprofen 800mg  BID and Tylenol 325mg  BID, few weeks at a time for flares Admits some increasing back pain flares.   Tobacco Abuse Smoking cessation ready to quit, trial chantix     Health Maintenance:   Prostate CA Screening: Last PSA 0.52 (2023) previously 0.47 (2022). Currently asymptomatic without BPH LUTS. No known family history of prostate CA.   Colon CA Screening - last cologuard 12/31/19 negative. Repeat due 12/2022  Shingles vaccine due if interested. He  will decline now.     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   L ring finger, distal infection due to strep, req debridement then amputation, [facility]   Social History   Socioeconomic History   Marital status: Married    Spouse name: Not on file   Number of children: Not on file   Years of education: High School   Highest education level: High school graduate  Occupational History   Occupation: [employer]  Tobacco Use   Smoking status: Every Day    Packs/day: 1.00    Years: 32.00    Total pack years: 32.00    Types: Cigarettes   Smokeless tobacco: Never  Vaping Use   Vaping Use: Never used  Substance  and Sexual Activity   Alcohol use: Yes    Alcohol/week: 2.0 standard drinks of alcohol    Types: 2 Standard drinks or equivalent per week   Drug use: Never   Sexual activity: Not on file  Other Topics Concern   Not on file  Social History Narrative   Not on file   Social Determinants of Health   Financial Resource Strain: Not on file  Food Insecurity: Not on file  Transportation Needs: Not on file  Physical Activity: Not on file  Stress: Not on file  Social Connections: Not on file  Intimate Partner Violence: Not on file   Family History  Problem Relation Age of Onset   Diabetes Mother    Hyperlipidemia Mother    Depression Father    Alcohol abuse Father    Hyperlipidemia Father    Hypertension Father    Hyperlipidemia Brother     Lung cancer Maternal Grandmother 63   Heart attack Maternal Grandfather 44   Heart attack Maternal Uncle 44   Colon cancer Neg Hx    Current Outpatient Medications on File Prior to Visit  Medication Sig   azelastine (ASTELIN) 0.1 % nasal spray Place 2 sprays into both nostrils 2 (two) times daily. Use in each nostril as directed   B Complex-C-E-Zn (B COMPLEX-C-E-ZINC) tablet Take 1 tablet by mouth daily.    Esomeprazole Magnesium (NEXIUM PO) Take 20 mg by mouth daily.   ibuprofen (ADVIL,MOTRIN) 800 MG tablet Take 800 mg by mouth 2 (two) times daily.   Zinc 100 MG TABS Take 50 each by mouth.   diclofenac Sodium (VOLTAREN) 1 % GEL Apply 2 g topically 3 (three) times daily as needed (wrist joint pain). (Patient not taking: Reported on 06/30/2020)   No current facility-administered medications on file prior to visit.    Review of Systems  Constitutional:  Negative for activity change, appetite change, chills, diaphoresis, fatigue and fever.  HENT:  Negative for congestion and hearing loss.   Eyes:  Negative for visual disturbance.  Respiratory:  Negative for cough, chest tightness, shortness of breath and wheezing.   Cardiovascular:  Negative for chest pain, palpitations and leg swelling.  Gastrointestinal:  Negative for abdominal pain, constipation, diarrhea, nausea and vomiting.  Genitourinary:  Negative for dysuria, frequency and hematuria.  Musculoskeletal:  Negative for arthralgias and neck pain.  Skin:  Negative for rash.  Neurological:  Negative for dizziness, weakness, light-headedness, numbness and headaches.  Hematological:  Negative for adenopathy.  Psychiatric/Behavioral:  Negative for behavioral problems, dysphoric mood and sleep disturbance.    Per HPI unless specifically indicated above      Objective:    BP 116/74   Pulse 68   Ht $R'6\' 4"'nD$  (1.93 m)   Wt 227 lb 3.2 oz (103.1 kg)   SpO2 98%   BMI 27.66 kg/m   Wt Readings from Last 3 Encounters:  04/24/22 227 lb  3.2 oz (103.1 kg)  04/13/21 226 lb 12.8 oz (102.9 kg)  11/19/19 211 lb (95.7 kg)    Physical Exam Vitals and nursing note reviewed.  Constitutional:      General: He is not in acute distress.    Appearance: He is well-developed. He is not diaphoretic.     Comments: Well-appearing, comfortable, cooperative  HENT:     Head: Normocephalic and atraumatic.  Eyes:     General:        Right eye: No discharge.        Left eye: No discharge.  Conjunctiva/sclera: Conjunctivae normal.     Pupils: Pupils are equal, round, and reactive to light.  Neck:     Thyroid: No thyromegaly.     Vascular: No carotid bruit.  Cardiovascular:     Rate and Rhythm: Normal rate and regular rhythm.     Pulses: Normal pulses.     Heart sounds: Normal heart sounds. No murmur heard. Pulmonary:     Effort: Pulmonary effort is normal. No respiratory distress.     Breath sounds: Normal breath sounds. No wheezing or rales.  Abdominal:     General: Bowel sounds are normal. There is no distension.     Palpations: Abdomen is soft. There is no mass.     Tenderness: There is no abdominal tenderness.  Musculoskeletal:        General: No tenderness. Normal range of motion.     Cervical back: Normal range of motion and neck supple.     Right lower leg: No edema.     Left lower leg: No edema.     Comments: Upper / Lower Extremities: - Normal muscle tone, strength bilateral upper extremities 5/5, lower extremities 5/5  Lymphadenopathy:     Cervical: No cervical adenopathy.  Skin:    General: Skin is warm and dry.     Findings: No erythema or rash.  Neurological:     Mental Status: He is alert and oriented to person, place, and time.     Comments: Distal sensation intact to light touch all extremities  Psychiatric:        Mood and Affect: Mood normal.        Behavior: Behavior normal.        Thought Content: Thought content normal.     Comments: Well groomed, good eye contact, normal speech and thoughts       Results for orders placed or performed in visit on 04/19/22  PSA  Result Value Ref Range   PSA 0.52 < OR = 4.00 ng/mL  Hemoglobin A1c  Result Value Ref Range   Hgb A1c MFr Bld 5.2 <5.7 % of total Hgb   Mean Plasma Glucose 103 mg/dL   eAG (mmol/L) 5.7 mmol/L  Lipid panel  Result Value Ref Range   Cholesterol 164 <200 mg/dL   HDL 48 > OR = 40 mg/dL   Triglycerides 82 <150 mg/dL   LDL Cholesterol (Calc) 99 mg/dL (calc)   Total CHOL/HDL Ratio 3.4 <5.0 (calc)   Non-HDL Cholesterol (Calc) 116 <130 mg/dL (calc)  CBC with Differential/Platelet  Result Value Ref Range   WBC 5.8 3.8 - 10.8 Thousand/uL   RBC 4.95 4.20 - 5.80 Million/uL   Hemoglobin 14.7 13.2 - 17.1 g/dL   HCT 44.4 38.5 - 50.0 %   MCV 89.7 80.0 - 100.0 fL   MCH 29.7 27.0 - 33.0 pg   MCHC 33.1 32.0 - 36.0 g/dL   RDW 12.8 11.0 - 15.0 %   Platelets 209 140 - 400 Thousand/uL   MPV 10.4 7.5 - 12.5 fL   Neutro Abs 2,535 1,500 - 7,800 cells/uL   Lymphs Abs 2,250 850 - 3,900 cells/uL   Absolute Monocytes 441 200 - 950 cells/uL   Eosinophils Absolute 522 (H) 15 - 500 cells/uL   Basophils Absolute 52 0 - 200 cells/uL   Neutrophils Relative % 43.7 %   Total Lymphocyte 38.8 %   Monocytes Relative 7.6 %   Eosinophils Relative 9.0 %   Basophils Relative 0.9 %  COMPLETE METABOLIC PANEL WITH GFR  Result Value Ref Range   Glucose, Bld 92 65 - 99 mg/dL   BUN 12 7 - 25 mg/dL   Creat 0.88 0.70 - 1.30 mg/dL   eGFR 103 > OR = 60 mL/min/1.32m2   BUN/Creatinine Ratio NOT APPLICABLE 6 - 22 (calc)   Sodium 140 135 - 146 mmol/L   Potassium 4.3 3.5 - 5.3 mmol/L   Chloride 104 98 - 110 mmol/L   CO2 26 20 - 32 mmol/L   Calcium 9.5 8.6 - 10.3 mg/dL   Total Protein 7.1 6.1 - 8.1 g/dL   Albumin 4.6 3.6 - 5.1 g/dL   Globulin 2.5 1.9 - 3.7 g/dL (calc)   AG Ratio 1.8 1.0 - 2.5 (calc)   Total Bilirubin 0.7 0.2 - 1.2 mg/dL   Alkaline phosphatase (APISO) 62 35 - 144 U/L   AST 17 10 - 35 U/L   ALT 16 9 - 46 U/L      Assessment &  Plan:   Problem List Items Addressed This Visit     Chronic bilateral low back pain with right-sided sciatica    Stable chronic problem Lumbar DDD, Osteoarthritis DJD Chronic pain generator causing symptoms See HPI for past history  Will adjust gabapentin since unsure if effective Reduced Gabapentin from 600 to 300mg  capsules, follow up if this is not helping we can figure out other options.      Relevant Medications   gabapentin (NEURONTIN) 300 MG capsule   varenicline (CHANTIX PAK) 0.5 MG X 11 & 1 MG X 42 tablet   DDD (degenerative disc disease), lumbar   Relevant Medications   gabapentin (NEURONTIN) 300 MG capsule   Essential hypertension    Controlled HTN No known complications   Plan:  1. Continue current BP regimen Lisinopril 10mg  daily re order 2. Encourage improved lifestyle - low sodium diet 3. Continue to monitor BP outside office, bring readings to next visit, if persistently >140/90 or new symptoms notify office sooner      Relevant Medications   rosuvastatin (CRESTOR) 10 MG tablet   lisinopril (ZESTRIL) 10 MG tablet   Hyperlipidemia    Controlled cholesterol on statin and lifestyle  Plan: 1. Continue current meds - Rosuvastatin 10mg  daily, tolerating well, advise to keep for ASCVD risk reduction w/ his high risk fam history heart disease 2. Encourage improved lifestyle - low carb/cholesterol, reduce portion size, continue improving regular exercise      Relevant Medications   rosuvastatin (CRESTOR) 10 MG tablet   lisinopril (ZESTRIL) 10 MG tablet   Other Visit Diagnoses     Annual physical exam    -  Primary   Tobacco dependence       Relevant Medications   varenicline (CHANTIX PAK) 0.5 MG X 11 & 1 MG X 42 tablet   Overweight (BMI 25.0-29.9)           Updated Health Maintenance information Reviewed recent lab results with patient Encouraged improvement to lifestyle with diet and exercise Goal of weight loss  Recommend screening  CT Lung  Screening for cancer, age 75+ smoker, can get this approved by insurance. Should be free.  Coronary Calcium Score / CT including Heart Pictures - this would be $100 fee from [facility]  #Tobacco abuse dependence  Plan: Start Varenicline (Chantix) Day 1-3: 0.5mg  once daily WITH FOOD, Days 4-7: increase to 0.5mg  BID, then maintenance dose after 1 week: 1mg  BID for up to 12 weeks - Set quit date 1 week after start of medication, alternatively  if unable to quit, then set goal reduce by 50% or more by 4 weeks, then another 50% reduction in 4 more weeks, and lastly quit after final 4 weeks (total 12 week therapy) - Optional additional 12 weeks if needed for maintenance - Counseling on side effects primarily nausea (take with food), headaches, insomnia, vivid dreams, mood instability, seizure, rare risk of suicidal ideation, if any serious agitation or acute depression severe mood changes need to stop med  Discussion today >5 minutes (<10 minutes) specifically on counseling on risks of tobacco use, complications, treatment, smoking cessation.     Meds ordered this encounter  Medications   rosuvastatin (CRESTOR) 10 MG tablet    Sig: Take 1 tablet (10 mg total) by mouth at bedtime.    Dispense:  90 tablet    Refill:  3   lisinopril (ZESTRIL) 10 MG tablet    Sig: Take 1 tablet (10 mg total) by mouth daily.    Dispense:  90 tablet    Refill:  3   gabapentin (NEURONTIN) 300 MG capsule    Sig: Take 1 capsule (300 mg total) by mouth in the morning and at bedtime.    Dispense:  180 capsule    Refill:  3   varenicline (CHANTIX PAK) 0.5 MG X 11 & 1 MG X 42 tablet    Sig: Take one 0.5 mg tab by mouth once daily for 3 days, increase to one 0.5 mg twice daily for 4 days, then increase to one 1 mg twice daily.    Dispense:  53 tablet    Refill:  0     Follow up plan: Return in about 1 year (around 04/25/2023) for 1 year fasting lab only then 1 week later Annual Physical.  Nobie Putnam,  DO [facility] 04/24/2022, 1:20 PM

## 2022-12-30 ENCOUNTER — Other Ambulatory Visit: Payer: Self-pay

## 2022-12-30 DIAGNOSIS — M5136 Other intervertebral disc degeneration, lumbar region: Secondary | ICD-10-CM

## 2023-01-25 ENCOUNTER — Ambulatory Visit
Admission: RE | Admit: 2023-01-25 | Discharge: 2023-01-25 | Disposition: A | Discharge status: Home / Self Care | Payer: Disability Insurance | Source: Ambulatory Visit | Attending: Diagnostic Radiology | Admitting: Diagnostic Radiology

## 2023-01-25 DIAGNOSIS — M5136 Other intervertebral disc degeneration, lumbar region: Secondary | ICD-10-CM | POA: Diagnosis present

## 2023-01-25 DIAGNOSIS — M5137 Other intervertebral disc degeneration, lumbosacral region: Secondary | ICD-10-CM | POA: Insufficient documentation

## 2023-04-17 ENCOUNTER — Other Ambulatory Visit: Payer: Self-pay | Admitting: Family Medicine

## 2023-04-17 DIAGNOSIS — G8929 Other chronic pain: Secondary | ICD-10-CM

## 2023-04-17 DIAGNOSIS — E785 Hyperlipidemia, unspecified: Secondary | ICD-10-CM

## 2023-04-17 DIAGNOSIS — M5136 Other intervertebral disc degeneration, lumbar region: Secondary | ICD-10-CM

## 2023-04-17 DIAGNOSIS — I10 Essential (primary) hypertension: Secondary | ICD-10-CM

## 2023-04-18 NOTE — Telephone Encounter (Signed)
OV needed for additional refills.  Requested Prescriptions  Pending Prescriptions Disp Refills   rosuvastatin (CRESTOR) 10 MG tablet [Pharmacy Med Name: rosuvastatin 10 mg tablet] 90 tablet 0    Sig: TAKE ONE TABLET BY MOUTH AT BEDTIME     Cardiovascular:  Antilipid - Statins 2 Failed - 04/17/2023  8:04 AM      Failed - Cr in normal range and within 360 days    Creat  Date Value Ref Range Status  04/19/2022 0.88 0.70 - 1.30 mg/dL Final         Failed - Lipid Panel in normal range within the last 12 months    Cholesterol  Date Value Ref Range Status  04/19/2022 164 <200 mg/dL Final   LDL Cholesterol (Calc)  Date Value Ref Range Status  04/19/2022 99 mg/dL (calc) Final    Comment:    Reference range: <100 . Desirable range <100 mg/dL for primary prevention;   <70 mg/dL for patients with CHD or diabetic patients  with > or = 2 CHD risk factors. Marland Kitchen LDL-C is now calculated using the Martin-Hopkins  calculation, which is a validated novel method providing  better accuracy than the Friedewald equation in the  estimation of LDL-C.  Horald Pollen et al. Lenox Ahr. 7829;562(13): 2061-2068  (http://education.[employer].com/faq/FAQ164)    HDL  Date Value Ref Range Status  04/19/2022 48 > OR = 40 mg/dL Final   Triglycerides  Date Value Ref Range Status  04/19/2022 82 <150 mg/dL Final         Passed - Patient is not pregnant      Passed - Valid encounter within last 12 months    Recent Outpatient Visits           11 months ago Annual physical exam   [facility] [place], Netta Neat, DO   2 years ago Annual physical exam   [place] [facility] Smitty Cords, DO   2 years ago Viral infection   [place] [facility], Jodelle Gross, FNP   3 years ago Annual physical exam   [place] [facility] Smitty Cords, DO   3 years ago [employer], right   [facility] [place], Netta Neat, DO               gabapentin (NEURONTIN) 300 MG capsule [Pharmacy Med Name: gabapentin 300 mg capsule] 180 capsule 0    Sig: TAKE ONE CAPSULE BY MOUTH TWICE DAILY morning AND bedtime     Neurology: Anticonvulsants - gabapentin Failed - 04/17/2023  8:04 AM      Failed - Cr in normal range and within 360 days    Creat  Date Value Ref Range Status  04/19/2022 0.88 0.70 - 1.30 mg/dL Final         Passed - Completed PHQ-2 or PHQ-9 in the last 360 days      Passed - Valid encounter within last 12 months    Recent Outpatient Visits           11 months ago Annual physical exam   [place] [facility] Smitty Cords, DO   2 years ago Annual physical exam   [place] [facility] Smitty Cords, DO   2 years ago Viral infection    [facility] [place], Jodelle Gross, FNP   3 years ago Annual physical exam  [place] [facility] [place], Netta Neat, DO   3 years ago [employer], right   [place] [facility] [place], Netta Neat, DO               lisinopril (ZESTRIL) 10 MG tablet [place] Med Name: lisinopril 10 mg tablet] 90 tablet 0    Sig: TAKE ONE TABLET BY MOUTH ONCE DAILY     Cardiovascular:  ACE Inhibitors Failed - 04/17/2023  8:04 AM      Failed - Cr in normal range and within 180 days    Creat  Date Value Ref Range Status  04/19/2022 0.88 0.70 - 1.30 mg/dL Final         Failed - K in normal range and within 180 days    Potassium  Date Value Ref Range Status  04/19/2022 4.3 3.5 - 5.3 mmol/L Final         Failed - Valid encounter within last 6 months    Recent Outpatient Visits           11 months ago Annual physical exam   [place] [facility] Smitty Cords, DO   2 years ago Annual physical exam   [place] [facility] Smitty Cords, DO   2 years ago Viral infection   [place] [facility], Jodelle Gross, FNP   3 years ago Annual physical exam   [place] [facility] Smitty Cords, DO   3 years ago [employer], right   [facility] [place], Netta Neat, [place] - Patient is not pregnant      Passed - Last BP in normal range    BP Readings from Last 1 Encounters:  04/24/22 116/74

## 2023-07-14 ENCOUNTER — Other Ambulatory Visit: Payer: Self-pay | Admitting: Family Medicine

## 2023-07-14 DIAGNOSIS — E785 Hyperlipidemia, unspecified: Secondary | ICD-10-CM

## 2023-07-14 DIAGNOSIS — I10 Essential (primary) hypertension: Secondary | ICD-10-CM

## 2023-07-14 DIAGNOSIS — G8929 Other chronic pain: Secondary | ICD-10-CM

## 2023-07-14 DIAGNOSIS — M51369 Other intervertebral disc degeneration, lumbar region without mention of lumbar back pain or lower extremity pain: Secondary | ICD-10-CM

## 2023-07-15 ENCOUNTER — Telehealth: Payer: Self-pay

## 2023-07-15 ENCOUNTER — Other Ambulatory Visit: Payer: Self-pay | Admitting: Family Medicine

## 2023-07-15 DIAGNOSIS — I10 Essential (primary) hypertension: Secondary | ICD-10-CM

## 2023-07-15 DIAGNOSIS — E785 Hyperlipidemia, unspecified: Secondary | ICD-10-CM

## 2023-07-15 DIAGNOSIS — G8929 Other chronic pain: Secondary | ICD-10-CM

## 2023-07-15 DIAGNOSIS — R7309 Other abnormal glucose: Secondary | ICD-10-CM

## 2023-07-15 DIAGNOSIS — E663 Overweight: Secondary | ICD-10-CM

## 2023-07-15 DIAGNOSIS — M51369 Other intervertebral disc degeneration, lumbar region without mention of lumbar back pain or lower extremity pain: Secondary | ICD-10-CM

## 2023-07-15 DIAGNOSIS — Z0000 Encounter for general adult medical examination without abnormal findings: Secondary | ICD-10-CM

## 2023-07-15 DIAGNOSIS — Z125 Encounter for screening for malignant neoplasm of prostate: Secondary | ICD-10-CM

## 2023-07-15 NOTE — Telephone Encounter (Signed)
Copied from CRM [id number]. Topic: Appointment Scheduling - Scheduling Inquiry for Clinic >> Jul 15, 2023 10:09 AM Payton Doughty wrote: Reason for CRM: pt has a physical scheduled 10/22 at 1:20 pm . Can you put in his lab orders please?

## 2023-07-15 NOTE — Telephone Encounter (Signed)
Requested Prescriptions  Pending Prescriptions Disp Refills   lisinopril (ZESTRIL) 10 MG tablet 90 tablet 0    Sig: Take 1 tablet (10 mg total) by mouth daily.     Cardiovascular:  ACE Inhibitors Failed - 07/15/2023 11:09 AM      Failed - Cr in normal range and within 180 days    Creat  Date Value Ref Range Status  04/19/2022 0.88 0.70 - 1.30 mg/dL Final         Failed - K in normal range and within 180 days    Potassium  Date Value Ref Range Status  04/19/2022 4.3 3.5 - 5.3 mmol/L Final         Failed - Valid encounter within last 6 months    Recent Outpatient Visits           1 year ago Annual physical exam   [place] [facility] Smitty Cords, DO   2 years ago Annual physical exam   [place] [facility] Smitty Cords, DO   3 years ago Viral infection   [place] [facility], Jodelle Gross, FNP   3 years ago Annual physical exam   [place] [facility] Smitty Cords, DO   3 years ago [employer], right   [place] [facility] Althea Charon, Netta Neat, DO       Future Appointments             In 1 week Althea Charon, Netta Neat, DO [facility]            Passed - Patient is not pregnant      Passed - Last BP in normal range    BP Readings from Last 1 Encounters:  04/24/22 116/74          rosuvastatin (CRESTOR) 10 MG tablet 90 tablet 0    Sig: Take 1 tablet (10 mg total) by mouth at bedtime.     Cardiovascular:  Antilipid - Statins 2 Failed - 07/15/2023 11:09 AM      Failed - Cr in normal range and within 360 days    Creat  Date Value Ref Range Status  04/19/2022 0.88 0.70 - 1.30 mg/dL Final         Failed - Valid encounter within last 12 months    Recent Outpatient Visits           1 year ago Annual physical exam   [place] [facility] Althea Charon, Netta Neat, DO   2 years ago Annual physical exam   [place] [facility] Smitty Cords, DO   3 years ago Viral infection   [place] [facility], Jodelle Gross, FNP   3 years ago Annual physical exam   [place] [facility] Smitty Cords, DO   3 years ago [employer], right   [place] [facility] Althea Charon, Netta Neat, DO       Future Appointments             In 1 week Althea Charon Netta Neat, DO  [facility]            Failed - Lipid Panel in normal range within the last 12 months    Cholesterol  Date Value Ref Range Status  04/19/2022 164 <200 mg/dL Final  LDL Cholesterol (Calc)  Date Value Ref Range Status  04/19/2022 99 mg/dL (calc) Final    Comment:    Reference range: <100 . Desirable range <100 mg/dL for primary prevention;   <70 mg/dL for patients with CHD or diabetic patients  with > or = 2 CHD risk factors. Marland Kitchen LDL-C is now calculated using the Martin-Hopkins  calculation, which is a validated novel method providing  better accuracy than the Friedewald equation in the  estimation of LDL-C.  Horald Pollen et al. Lenox Ahr. 4098;119(14): 2061-2068  (http://education.[employer].com/faq/FAQ164)    HDL  Date Value Ref Range Status  04/19/2022 48 > OR = 40 mg/dL Final   Triglycerides  Date Value Ref Range Status  04/19/2022 82 <150 mg/dL Final         Passed - Patient is not pregnant       gabapentin (NEURONTIN) 300 MG capsule 180 capsule 0     Neurology: Anticonvulsants - gabapentin Failed - 07/15/2023 11:09 AM      Failed - Cr in normal range and within 360 days    Creat  Date Value Ref Range Status  04/19/2022 0.88 0.70 - 1.30 mg/dL Final         Failed - Completed PHQ-2 or PHQ-9 in the last 360 days      Failed - Valid encounter within last 12 months    Recent Outpatient Visits           1 year ago  Annual physical exam   [place] [facility] Smitty Cords, DO   2 years ago Annual physical exam   [place] [facility] Smitty Cords, DO   3 years ago Viral infection   [place] [facility], Jodelle Gross, FNP   3 years ago Annual physical exam   [place] [facility] Smitty Cords, DO   3 years ago [employer], right   [place] [facility], Netta Neat, DO       Future Appointments             In 1 week Althea Charon, Netta Neat, DO [place] [facility], [facility]

## 2023-07-15 NOTE — Telephone Encounter (Signed)
I called patient and scheduled a lab appointment for tomorrow, 07/16/23, at 8:00 am

## 2023-07-15 NOTE — Telephone Encounter (Signed)
Requested medication (s) are due for refill today: yes  Requested medication (s) are on the active medication list: yes  Last refill:  all 3 meds last RF 04/18/23 3 month refills each  Future visit scheduled: no  Notes to clinic:  overdue for appt- called pt and LM on VM to Make appt for CPE   Requested Prescriptions  Pending Prescriptions Disp Refills   gabapentin (NEURONTIN) 300 MG capsule [Pharmacy Med Name: gabapentin 300 mg capsule] 180 capsule 0    Sig: TAKE ONE CAPSULE BY MOUTH TWICE DAILY morning AND bedtime     Neurology: Anticonvulsants - gabapentin Failed - 07/14/2023 10:21 AM      Failed - Cr in normal range and within 360 days    Creat  Date Value Ref Range Status  04/19/2022 0.88 0.70 - 1.30 mg/dL Final         Failed - Completed PHQ-2 or PHQ-9 in the last 360 days      Failed - Valid encounter within last 12 months    Recent Outpatient Visits           1 year ago Annual physical exam   [place] [facility] Smitty Cords, DO   2 years ago Annual physical exam   [place] [facility] Smitty Cords, DO   3 years ago Viral infection   [place] [facility], Jodelle Gross, FNP   3 years ago Annual physical exam   [place] [facility] [place], Netta Neat, DO   3 years ago [employer], right   [place] [facility], Netta Neat, DO               lisinopril (ZESTRIL) 10 MG tablet [Pharmacy Med Name: lisinopril 10 mg tablet] 90 tablet 0    Sig: TAKE ONE TABLET BY MOUTH ONCE DAILY     Cardiovascular:  ACE Inhibitors Failed - 07/14/2023 10:21 AM      Failed - Cr in normal range and within 180 days    Creat  Date Value Ref Range Status  04/19/2022 0.88 0.70 - 1.30 mg/dL Final         Failed - K in normal range and within 180 days    Potassium  Date Value Ref Range Status  04/19/2022 4.3 3.5 - 5.3 mmol/L Final          Failed - Valid encounter within last 6 months    Recent Outpatient Visits           1 year ago Annual physical exam   [place] [facility] Smitty Cords, DO   2 years ago Annual physical exam   [place] [facility] Smitty Cords, DO   3 years ago Viral infection   [place] [facility], Jodelle Gross, FNP   3 years ago Annual physical exam   [place] [facility] Smitty Cords, DO   3 years ago [employer], right    [facility] [place], Netta Neat, [place] - Patient is not pregnant      Passed - Last BP in normal range    BP Readings from Last 1 Encounters:  04/24/22 116/74          rosuvastatin (CRESTOR) 10 MG tablet [  Pharmacy Med Name: rosuvastatin 10 mg tablet] 90 tablet 0    Sig: TAKE ONE TABLET BY MOUTH AT BEDTIME     Cardiovascular:  Antilipid - Statins 2 Failed - 07/14/2023 10:21 AM      Failed - Cr in normal range and within 360 days    Creat  Date Value Ref Range Status  04/19/2022 0.88 0.70 - 1.30 mg/dL Final         Failed - Valid encounter within last 12 months    Recent Outpatient Visits           1 year ago Annual physical exam   [place] [facility] Smitty Cords, DO   2 years ago Annual physical exam   [place] [facility] Smitty Cords, DO   3 years ago Viral infection   [place] [facility], Jodelle Gross, FNP   3 years ago Annual physical exam   [place] [facility] Smitty Cords, DO   3 years ago [employer], right   [facility], Netta Neat, DO              Failed - Lipid Panel in normal range within the last 12 months    Cholesterol  Date Value Ref Range Status  04/19/2022 164 <200 mg/dL  Final   LDL Cholesterol (Calc)  Date Value Ref Range Status  04/19/2022 99 mg/dL (calc) Final    Comment:    Reference range: <100 . Desirable range <100 mg/dL for primary prevention;   <70 mg/dL for patients with CHD or diabetic patients  with > or = 2 CHD risk factors. Marland Kitchen LDL-C is now calculated using the Martin-Hopkins  calculation, which is a validated novel method providing  better accuracy than the Friedewald equation in the  estimation of LDL-C.  Horald Pollen et al. Lenox Ahr. 8315;176(16): 2061-2068  (http://education.[facility].com/faq/FAQ164)    HDL  Date Value Ref Range Status  04/19/2022 48 > OR = 40 mg/dL Final   Triglycerides  Date Value Ref Range Status  04/19/2022 82 <150 mg/dL Final         Passed - Patient is not pregnant

## 2023-07-15 NOTE — Telephone Encounter (Signed)
Medication Refill - Medication: lisinopril (ZESTRIL) 10 MG tablet   rosuvastatin (CRESTOR) 10 MG tablet gabapentin (NEURONTIN) 300 MG capsule  Has the patient contacted their pharmacy? Yes.   Someone called him and advised he needed appt for his refills.  Pt is out of medication, and asking for enough to get through to appt next Tues, 10/22  Preferred Pharmacy (with phone number or street name): [facility] - [place], [place] - [address]  Has the patient been seen for an appointment in the last year OR does the patient have an upcoming appointment? Yes.    Agent: Please be advised that RX refills may take up to 3 business days. We ask that you follow-up with your pharmacy.

## 2023-07-15 NOTE — Telephone Encounter (Signed)
Please contact patient to arrange Lab Only apt before his apt 10/22  I have placed his lab orders now.  Saralyn Pilar, DO [facility] 07/15/2023, 12:12 PM

## 2023-07-16 ENCOUNTER — Other Ambulatory Visit: Payer: 59

## 2023-07-16 DIAGNOSIS — E785 Hyperlipidemia, unspecified: Secondary | ICD-10-CM | POA: Diagnosis not present

## 2023-07-16 DIAGNOSIS — I10 Essential (primary) hypertension: Secondary | ICD-10-CM | POA: Diagnosis not present

## 2023-07-16 DIAGNOSIS — Z0000 Encounter for general adult medical examination without abnormal findings: Secondary | ICD-10-CM | POA: Diagnosis not present

## 2023-07-16 DIAGNOSIS — R7309 Other abnormal glucose: Secondary | ICD-10-CM

## 2023-07-16 DIAGNOSIS — E663 Overweight: Secondary | ICD-10-CM

## 2023-07-16 DIAGNOSIS — Z125 Encounter for screening for malignant neoplasm of prostate: Secondary | ICD-10-CM

## 2023-07-17 LAB — COMPLETE METABOLIC PANEL WITH GFR
AG Ratio: 1.6 (calc) (ref 1.0–2.5)
ALT: 29 U/L (ref 9–46)
AST: 25 U/L (ref 10–35)
Albumin: 4.5 g/dL (ref 3.6–5.1)
Alkaline phosphatase (APISO): 74 U/L (ref 35–144)
BUN: 13 mg/dL (ref 7–25)
CO2: 27 mmol/L (ref 20–32)
Calcium: 9.1 mg/dL (ref 8.6–10.3)
Chloride: 103 mmol/L (ref 98–110)
Creat: 0.74 mg/dL (ref 0.70–1.30)
Globulin: 2.9 g/dL (ref 1.9–3.7)
Glucose, Bld: 99 mg/dL (ref 65–99)
Potassium: 4.9 mmol/L (ref 3.5–5.3)
Sodium: 140 mmol/L (ref 135–146)
Total Bilirubin: 0.6 mg/dL (ref 0.2–1.2)
Total Protein: 7.4 g/dL (ref 6.1–8.1)
eGFR: 108 mL/min/{1.73_m2} (ref >=60)

## 2023-07-17 LAB — HEMOGLOBIN A1C
Hgb A1c MFr Bld: 5.7 %{Hb} — ABNORMAL HIGH (ref <5.7)
Mean Plasma Glucose: 117 mg/dL
eAG (mmol/L): 6.5 mmol/L

## 2023-07-17 LAB — CBC WITH DIFFERENTIAL/PLATELET
Absolute Lymphocytes: 2019 {cells}/uL (ref 850–3900)
Absolute Monocytes: 407 {cells}/uL (ref 200–950)
Basophils Absolute: 39 {cells}/uL (ref 0–200)
Basophils Relative: 0.7 %
Eosinophils Absolute: 567 {cells}/uL — ABNORMAL HIGH (ref 15–500)
Eosinophils Relative: 10.3 %
HCT: 45.5 % (ref 38.5–50.0)
Hemoglobin: 14.8 g/dL (ref 13.2–17.1)
MCH: 29.4 pg (ref 27.0–33.0)
MCHC: 32.5 g/dL (ref 32.0–36.0)
MCV: 90.3 fL (ref 80.0–100.0)
MPV: 10.8 fL (ref 7.5–12.5)
Monocytes Relative: 7.4 %
Neutro Abs: 2470 {cells}/uL (ref 1500–7800)
Neutrophils Relative %: 44.9 %
Platelets: 211 10*3/uL (ref 140–400)
RBC: 5.04 10*6/uL (ref 4.20–5.80)
RDW: 12.6 % (ref 11.0–15.0)
Total Lymphocyte: 36.7 %
WBC: 5.5 10*3/uL (ref 3.8–10.8)

## 2023-07-17 LAB — TSH: TSH: 1.36 m[IU]/L (ref 0.40–4.50)

## 2023-07-17 LAB — LIPID PANEL
Cholesterol: 180 mg/dL (ref <200)
HDL: 52 mg/dL (ref >=40)
LDL Cholesterol (Calc): 107 mg/dL — ABNORMAL HIGH
Non-HDL Cholesterol (Calc): 128 mg/dL (ref <130)
Total CHOL/HDL Ratio: 3.5 (calc) (ref <5.0)
Triglycerides: 114 mg/dL (ref <150)

## 2023-07-17 LAB — PSA: PSA: 0.58 ng/mL (ref <=4.00)

## 2023-07-22 ENCOUNTER — Encounter: Payer: Self-pay | Admitting: Family Medicine

## 2023-07-22 ENCOUNTER — Ambulatory Visit (INDEPENDENT_AMBULATORY_CARE_PROVIDER_SITE_OTHER): Payer: 59 | Admitting: Family Medicine

## 2023-07-22 VITALS — BP 130/80 | HR 73 | Ht 74.5 in | Wt 238.0 lb

## 2023-07-22 DIAGNOSIS — I10 Essential (primary) hypertension: Secondary | ICD-10-CM | POA: Diagnosis not present

## 2023-07-22 DIAGNOSIS — Z0000 Encounter for general adult medical examination without abnormal findings: Secondary | ICD-10-CM | POA: Diagnosis not present

## 2023-07-22 DIAGNOSIS — Z1211 Encounter for screening for malignant neoplasm of colon: Secondary | ICD-10-CM | POA: Diagnosis not present

## 2023-07-22 DIAGNOSIS — E7800 Pure hypercholesterolemia, unspecified: Secondary | ICD-10-CM | POA: Diagnosis not present

## 2023-07-22 DIAGNOSIS — R7309 Other abnormal glucose: Secondary | ICD-10-CM

## 2023-07-22 NOTE — Patient Instructions (Addendum)
Thank you for coming to the office today.  Recent Labs    07/16/23 0807  HGBA1C 5.7*   Goal to limit excess carb starches sugars. And improve exercise plan.  Goal to reduce some weight to help control cholesterol and sugar.  Alpha Lipoic Acid (ALA) 600mg  3 times a day for nerve health  -----------------  Ordered the Cologuard (home kit) test for colon cancer screening. Stay tuned for further updates.  It will be shipped to you directly. If not received in 2-4 weeks, call us or the company.   If you send it back and no results are received in 2-4 weeks, call us or the company as well!   Colon Cancer Screening: - For all adults age 55+ routine colon cancer screening is highly recommended.     - Recent guidelines from American Cancer Society recommend starting age of 50 - Early detection of colon cancer is important, because often there are no warning signs or symptoms, also if found early usually it can be cured. Late stage is hard to treat.   - If Cologuard is NEGATIVE, then it is good for 3 years before next due - If Cologuard is POSITIVE, then it is strongly advised to get a Colonoscopy, which allows the GI doctor to locate the source of the cancer or polyp (even very early stage) and treat it by removing it. ------------------------- Follow instructions to collect sample, you may call the company for any help or questions, 24/7 telephone support at [phone].   -----------------------   Future options We can refer for a Coronary Calcium Score Cardiac CT Scan. This is a screening test for patients aged 74-50+ with cardiovascular risk factors or who are healthy but would be interested in Cardiovascular Screening for heart disease. Even if there is a family history of heart disease, this imaging can be useful. Typically it can be done every 5+ years or at a different timeline we agree on  The scan will look at the chest and mainly focus on the heart and identify early  signs of calcium build up or blockages within the heart arteries. It is not 100% accurate for identifying blockages or heart disease, but it is useful to help [place] predict who may have some early changes or be at risk in the future for a heart attack or cardiovascular problem.  The results are reviewed by a Cardiologist and they will document the results. It should become available on MyChart. Typically the results are divided into percentiles based on other patients of the same demographic and age. So it will compare your risk to others similar to you. If you have a higher score >99 or higher percentile >75%tile, it is recommended to consider Statin cholesterol therapy and or referral to Cardiologist. I will try to help explain your results and if we have questions we can contact the Cardiologist.  You will be contacted for scheduling. Usually it is done at any imaging facility through [facility], [facility] or [facility].  The cost is $99 flat fee total and it does not go through insurance, so no authorization is required.    Please schedule a Follow-up Appointment to: Return in about 6 months (around 01/20/2024) for 6 month PreDM A1c (any day any time).  If you have any other questions or concerns, please feel free to call the office or send a message through MyChart. You may also schedule an earlier appointment if necessary.  Additionally, you may be receiving a  survey about your experience at our office within a few days to 1 week by e-mail or mail. We value your feedback.  Saralyn Pilar, DO [facility], [place]

## 2023-07-22 NOTE — Progress Notes (Signed)
Subjective:    Patient ID: Steven Horn, male    DOB: 23-Jan-1971, 52 y.o.   MRN: [id number]  Steven Horn is a 52 y.o. male presenting on 07/22/2023 for Annual Exam   HPI  Here for Annual Physical and Lab Review.  Discussed the use of AI scribe software for clinical note transcription with the patient, who gave verbal consent to proceed.   Elevated A1c Elevated A1c 5.7, prior range 5.2 to 5.3 He admits eating higher carb starch sugars, goal to improve He will resume exercise walking now  He is now on disability He has been working on his art with drawing, acrylic and oil paintings.   CHRONIC HTN: Reports no concerns with BP currently. Checks BP occasionally Current Meds - Lisinopril 10mg  daily Reports good compliance, took meds today. Tolerating well, w/o complaints. Denies CP, dyspnea, HA, edema, dizziness / lightheadedness   HYPERLIPIDEMIA / BMI >30 Elevated LDL 107 on last lab. He continues on Statin. he does have early family history of MI / CAD on maternal side - Currently taking Rosuvastatin 10mg  daily, tolerating well without side effects or myalgias Lifestyle - Maternal family history uncle passed age 83 MI He reports a change in lifestyle and eating habits over the past few years leaving his previous job and on disability, which has resulted in a weight gain of approximately 10-11 pounds in the past 3 months. This weight gain is suspected to have contributed to a slight increase in his blood sugar levels, which are now at the borderline of prediabetes, and a minor increase in cholesterol levels.   Chronic Low Back Pain, DDD Lumbar Spine / History of OA/DJD in other joints (hands R > L) Reports no prior actual back injury, >15+ years of this issue, seems to be gradual - Previously had seen [facility], and then eventually went to Ortho / Pain for repeat MRI, and he has received ESI in past with limited. - Taking Gabapentin 300mg  twice a day now - Taking NSAID  OTC AS NEEDED and Tylenol 325mg  BID, few weeks at a time for flares The patient also reports persistent numbness in his toes, which is believed to be related to a back issue affecting the sciatic nerve. He is currently on gabapentin for this condition. The patient also mentions occasional discomfort under the breastbone, described as a knuckle trying to protrude. This discomfort is suspected to be related to lung irritation due to smoking, as the patient admits to being a smoker.     Tobacco Abuse Smoking cessation, can reconsider chantix Not ready to quit.      Health Maintenance:   Prostate CA Screening: Last PSA 0.58 (2024). Currently asymptomatic without BPH LUTS. No known family history of prostate CA.    Colon CA Screening - last cologuard 12/31/19 negative. Repeat due 12/2022   Shingles vaccine due if interested. He will decline now       07/22/2023    1:14 PM 04/24/2022    1:36 PM 04/13/2021    1:43 PM  Depression screen PHQ 2/9  Decreased Interest 0 0 0  Down, Depressed, Hopeless 0 0 0  PHQ - 2 Score 0 0 0  Altered sleeping 0  0  Tired, decreased energy 0  1  Change in appetite 0  0  Feeling bad or failure about yourself  0  0  Trouble concentrating 0  0  Moving slowly or fidgety/restless 0    Suicidal thoughts 0  0  PHQ-9 Score  0  1  Difficult doing work/chores Not difficult at all  Not difficult at all      07/22/2023    1:14 PM 04/13/2021    1:43 PM  GAD 7 : Generalized Anxiety Score  Nervous, Anxious, on Edge 0 1  Control/stop worrying 0 0  Worry too much - different things 0 0  Trouble relaxing 0 0  Restless 0 0  Easily annoyed or irritable 0 1  Afraid - awful might happen 0 1  Total GAD 7 Score 0 3  Anxiety Difficulty  Not difficult at all     Past Medical History:  Diagnosis Date   Hyperlipidemia    Hypertension    Past Surgical History:  Procedure Laterality Date   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   L ring finger, distal infection due to  strep, req debridement then amputation, [facility]   Social History   Socioeconomic History   Marital status: Married    Spouse name: Not on file   Number of children: Not on file   Years of education: High School   Highest education level: High school graduate  Occupational History   Occupation: [employer]  Tobacco Use   Smoking status: Every Day    Current packs/day: 1.00    Average packs/day: 1 pack/day for 32.0 years (32.0 ttl pk-yrs)    Types: Cigarettes   Smokeless tobacco: Never  Vaping Use   Vaping status: Never Used  Substance and Sexual Activity   Alcohol use: Yes    Alcohol/week: 2.0 standard drinks of alcohol    Types: 2 Standard drinks or equivalent per week   Drug use: Never   Sexual activity: Not on file  Other Topics Concern   Not on file  Social History Narrative   Not on file   Social Determinants of Health   Financial Resource Strain: Not on file  Food Insecurity: Not on file  Transportation Needs: Not on file  Physical Activity: Not on file  Stress: Not on file  Social Connections: Not on file  Intimate Partner Violence: Not on file   Family History  Problem Relation Age of Onset   Diabetes Mother    Hyperlipidemia Mother    Depression Father    Alcohol abuse Father    Hyperlipidemia Father    Hypertension Father    Hyperlipidemia Brother    Lung cancer Maternal Grandmother 26   Heart attack Maternal Grandfather 44   Heart attack Maternal Uncle 44   Colon cancer Neg Hx    Current Outpatient Medications on File Prior to Visit  Medication Sig   gabapentin (NEURONTIN) 300 MG capsule TAKE ONE CAPSULE BY MOUTH TWICE DAILY morning AND bedtime   lisinopril (ZESTRIL) 10 MG tablet TAKE ONE TABLET BY MOUTH ONCE DAILY   rosuvastatin (CRESTOR) 10 MG tablet TAKE ONE TABLET BY MOUTH AT BEDTIME   No current facility-administered medications on file prior to visit.    Review of Systems  Constitutional:  Negative for activity change, appetite change,  chills, diaphoresis, fatigue and fever.  HENT:  Negative for congestion and hearing loss.   Eyes:  Negative for visual disturbance.  Respiratory:  Negative for cough, chest tightness, shortness of breath and wheezing.   Cardiovascular:  Negative for chest pain, palpitations and leg swelling.  Gastrointestinal:  Negative for abdominal pain, constipation, diarrhea, nausea and vomiting.  Genitourinary:  Negative for dysuria, frequency and hematuria.  Musculoskeletal:  Negative for arthralgias and neck pain.  Skin:  Negative for rash.  Neurological:  Negative for dizziness, weakness, light-headedness, numbness and headaches.  Hematological:  Negative for adenopathy.  Psychiatric/Behavioral:  Negative for behavioral problems, dysphoric mood and sleep disturbance.    Per HPI unless specifically indicated above      Objective:    BP 130/80   Pulse 73   Ht 6' 2.5" (1.892 m)   Wt 238 lb (108 kg)   SpO2 95%   BMI 30.15 kg/m   Wt Readings from Last 3 Encounters:  07/22/23 238 lb (108 kg)  04/24/22 227 lb 3.2 oz (103.1 kg)  04/13/21 226 lb 12.8 oz (102.9 kg)    Physical Exam Vitals and nursing note reviewed.  Constitutional:      General: He is not in acute distress.    Appearance: He is well-developed. He is not diaphoretic.     Comments: Well-appearing, comfortable, cooperative  HENT:     Head: Normocephalic and atraumatic.  Eyes:     General:        Right eye: No discharge.        Left eye: No discharge.     Conjunctiva/sclera: Conjunctivae normal.     Pupils: Pupils are equal, round, and reactive to light.  Neck:     Thyroid: No thyromegaly.     Vascular: No carotid bruit.  Cardiovascular:     Rate and Rhythm: Normal rate and regular rhythm.     Pulses: Normal pulses.     Heart sounds: Normal heart sounds. No murmur heard. Pulmonary:     Effort: Pulmonary effort is normal. No respiratory distress.     Breath sounds: Normal breath sounds. No wheezing or rales.   Abdominal:     General: Bowel sounds are normal. There is no distension.     Palpations: Abdomen is soft. There is no mass.     Tenderness: There is no abdominal tenderness.  Musculoskeletal:        General: No tenderness. Normal range of motion.     Cervical back: Normal range of motion and neck supple.     Right lower leg: No edema.     Left lower leg: No edema.     Comments: Upper / Lower Extremities: - Normal muscle tone, strength bilateral upper extremities 5/5, lower extremities 5/5  Lymphadenopathy:     Cervical: No cervical adenopathy.  Skin:    General: Skin is warm and dry.     Findings: No erythema or rash.  Neurological:     Mental Status: He is alert and oriented to person, place, and time.     Comments: Distal sensation intact to light touch all extremities  Psychiatric:        Mood and Affect: Mood normal.        Behavior: Behavior normal.        Thought Content: Thought content normal.     Comments: Well groomed, good eye contact, normal speech and thoughts      Results for orders placed or performed in visit on 07/16/23  TSH  Result Value Ref Range   TSH 1.36 0.40 - 4.50 mIU/L  PSA  Result Value Ref Range   PSA 0.58 < OR = 4.00 ng/mL  CBC with Differential/Platelet  Result Value Ref Range   WBC 5.5 3.8 - 10.8 Thousand/uL   RBC 5.04 4.20 - 5.80 Million/uL   Hemoglobin 14.8 13.2 - 17.1 g/dL   HCT 57.8 46.9 - 62.9 %   MCV 90.3 80.0 - 100.0 fL   MCH 29.4 27.0 -  33.0 pg   MCHC 32.5 32.0 - 36.0 g/dL   RDW 09.8 11.9 - 14.7 %   Platelets 211 140 - 400 Thousand/uL   MPV 10.8 7.5 - 12.5 fL   Neutro Abs 2,470 1,500 - 7,800 cells/uL   Absolute Lymphocytes 2,019 850 - 3,900 cells/uL   Absolute Monocytes 407 200 - 950 cells/uL   Eosinophils Absolute 567 (H) 15 - 500 cells/uL   Basophils Absolute 39 0 - 200 cells/uL   Neutrophils Relative % 44.9 %   Total Lymphocyte 36.7 %   Monocytes Relative 7.4 %   Eosinophils Relative 10.3 %   Basophils Relative 0.7 %   COMPLETE METABOLIC PANEL WITH GFR  Result Value Ref Range   Glucose, Bld 99 65 - 99 mg/dL   BUN 13 7 - 25 mg/dL   Creat 8.29 5.62 - 1.30 mg/dL   eGFR 865 > OR = 60 HQ/ION/6.29B2   BUN/Creatinine Ratio SEE NOTE: 6 - 22 (calc)   Sodium 140 135 - 146 mmol/L   Potassium 4.9 3.5 - 5.3 mmol/L   Chloride 103 98 - 110 mmol/L   CO2 27 20 - 32 mmol/L   Calcium 9.1 8.6 - 10.3 mg/dL   Total Protein 7.4 6.1 - 8.1 g/dL   Albumin 4.5 3.6 - 5.1 g/dL   Globulin 2.9 1.9 - 3.7 g/dL (calc)   AG Ratio 1.6 1.0 - 2.5 (calc)   Total Bilirubin 0.6 0.2 - 1.2 mg/dL   Alkaline phosphatase (APISO) 74 35 - 144 U/L   AST 25 10 - 35 U/L   ALT 29 9 - 46 U/L  Lipid panel  Result Value Ref Range   Cholesterol 180 <200 mg/dL   HDL 52 > OR = 40 mg/dL   Triglycerides 841 <324 mg/dL   LDL Cholesterol (Calc) 107 (H) mg/dL (calc)   Total CHOL/HDL Ratio 3.5 <5.0 (calc)   Non-HDL Cholesterol (Calc) 128 <130 mg/dL (calc)  Hemoglobin M0N  Result Value Ref Range   Hgb A1c MFr Bld 5.7 (H) <5.7 % of total Hgb   Mean Plasma Glucose 117 mg/dL   eAG (mmol/L) 6.5 mmol/L      Assessment & Plan:   Problem List Items Addressed This Visit     Essential hypertension    Controlled HTN No known complications   Plan:  1. Continue current BP regimen Lisinopril 10mg  daily 2. Encourage improved lifestyle - low sodium diet 3. Continue to monitor BP outside office, bring readings to next visit, if persistently >140/90 or new symptoms notify office sooner      Hyperlipidemia    Controlled cholesterol on statin and lifestyle - note some mild elevated LDL 107 The 10-year ASCVD risk score (Arnett DK, et al., 2019) is: 10%   Values used to calculate the score:     Age: 7 years     Sex: Male     Is Non-Hispanic African American: No     Diabetic: No     Tobacco smoker: Yes     Systolic Blood Pressure: 130 mmHg     Is BP treated: Yes     HDL Cholesterol: 52 mg/dL     Total Cholesterol: 180 mg/dL   Plan: 1. Continue  current meds - Rosuvastatin 10mg  daily, tolerating well, advise to keep for ASCVD risk reduction w/ his high risk fam history heart disease 2. Encourage improved lifestyle - low carb/cholesterol, reduce portion size, continue improving regular exercise  Recommend Coronary Calcium CT Score risk stratification, he will  consider this in future we can order if requested. $99 flat fee.      Other Visit Diagnoses     Annual physical exam    -  Primary   Colon cancer screening       Relevant Orders   Cologuard   Elevated hemoglobin A1c           Updated Health Maintenance information Reviewed recent lab results with patient Encouraged improvement to lifestyle with diet and exercise Goal of weight loss     Elevated A1c vs PreDM A1c increased to 5.7, up from previous values in the 5.2-5.3 range. Discussed the importance of lifestyle modifications including diet and exercise. -Encouraged to limit excess carbs, starches, sugars and improve exercise plan. -Plan to repeat A1c in 6 months. POC check  Paresthesia / Numbness in toes Ongoing issue, likely related to back/sciatic nerve. No significant pain or discomfort reported. -Continue Gabapentin 300mg  BID -Suggested trial of OTC Alpha Lipoic Acid (ALA) 600mg  three times a day for nerve health.  Skin Lesions Noted presence of seborrheic keratosis on scalp. No concern for malignancy. -No intervention needed at this time.  Due for routine colon cancer screening. Last Cologuard 12/2019 negative - Discussion today about recommendations for either Colonoscopy or Cologuard screening, benefits and risks of screening, interested in Cologuard, understands that if positive then recommendation is for diagnostic colonoscopy to follow-up. - Ordered Cologuard today  General Health Maintenance -Discussed the option of a heart scan for coronary artery disease screening, given family history of heart disease. Patient to consider. -Discussed the option of  a lung scan for lung cancer screening. Patient to consider. -Recommended Tetanus booster, as last one was in 2011. -Discussed Shingles vaccine. No decision made at this time. -Continue current medications including Gabapentin, Lisinopril, and Rosuvastatin. -Next appointment scheduled for January 20, 2024 for A1c check.        Orders Placed This Encounter  Procedures   Cologuard     No orders of the defined types were placed in this encounter.     Follow up plan: Return in about 6 months (around 01/20/2024) for 6 month PreDM A1c (any day any time).  6 month PreDM A1c POC  Saralyn Pilar, DO [facility] 07/22/2023, 1:18 PM

## 2023-07-22 NOTE — Assessment & Plan Note (Signed)
Controlled cholesterol on statin and lifestyle - note some mild elevated LDL 107 The 10-year ASCVD risk score (Arnett DK, et al., 2019) is: 10%   Values used to calculate the score:     Age: 52 years     Sex: Male     Is Non-Hispanic African American: No     Diabetic: No     Tobacco smoker: Yes     Systolic Blood Pressure: 130 mmHg     Is BP treated: Yes     HDL Cholesterol: 52 mg/dL     Total Cholesterol: 180 mg/dL   Plan: 1. Continue current meds - Rosuvastatin 10mg  daily, tolerating well, advise to keep for ASCVD risk reduction w/ his high risk fam history heart disease 2. Encourage improved lifestyle - low carb/cholesterol, reduce portion size, continue improving regular exercise  Recommend Coronary Calcium CT Score risk stratification, he will consider this in future we can order if requested. $99 flat fee.

## 2023-07-22 NOTE — Assessment & Plan Note (Signed)
Controlled HTN No known complications   Plan:  1. Continue current BP regimen Lisinopril 10mg  daily 2. Encourage improved lifestyle - low sodium diet 3. Continue to monitor BP outside office, bring readings to next visit, if persistently >140/90 or new symptoms notify office sooner

## 2024-01-20 ENCOUNTER — Ambulatory Visit: Payer: Self-pay | Admitting: Family Medicine

## 2024-01-20 ENCOUNTER — Telehealth: Payer: Self-pay

## 2024-01-20 NOTE — Telephone Encounter (Signed)
 Advised patient that we will do a finger poke at visit since it will be a A1c follow up. Verbalized understanding.

## 2024-01-20 NOTE — Telephone Encounter (Signed)
 Copied from CRM [id number]. Topic: General - Other >> Jan 20, 2024  8:49 AM Loreda Rodriguez T wrote: Reason for CRM: patient request a call back to let him know if he will be getting labs at this appt on 4/28 and if he needs to fast

## 2024-01-26 ENCOUNTER — Other Ambulatory Visit: Payer: Self-pay | Admitting: Family Medicine

## 2024-01-26 ENCOUNTER — Ambulatory Visit: Admitting: Family Medicine

## 2024-01-26 ENCOUNTER — Encounter: Payer: Self-pay | Admitting: Family Medicine

## 2024-01-26 VITALS — BP 124/74 | HR 59 | Resp 18 | Ht 74.5 in | Wt 238.0 lb

## 2024-01-26 DIAGNOSIS — R351 Nocturia: Secondary | ICD-10-CM

## 2024-01-26 DIAGNOSIS — R7309 Other abnormal glucose: Secondary | ICD-10-CM

## 2024-01-26 DIAGNOSIS — H8110 Benign paroxysmal vertigo, unspecified ear: Secondary | ICD-10-CM

## 2024-01-26 DIAGNOSIS — R42 Dizziness and giddiness: Secondary | ICD-10-CM | POA: Diagnosis not present

## 2024-01-26 DIAGNOSIS — E7800 Pure hypercholesterolemia, unspecified: Secondary | ICD-10-CM

## 2024-01-26 DIAGNOSIS — Z125 Encounter for screening for malignant neoplasm of prostate: Secondary | ICD-10-CM

## 2024-01-26 DIAGNOSIS — I10 Essential (primary) hypertension: Secondary | ICD-10-CM

## 2024-01-26 DIAGNOSIS — Z0000 Encounter for general adult medical examination without abnormal findings: Secondary | ICD-10-CM

## 2024-01-26 LAB — POCT GLYCOSYLATED HEMOGLOBIN (HGB A1C): Hemoglobin A1C: 5.5 % (ref 4.0–5.6)

## 2024-01-26 NOTE — Patient Instructions (Addendum)
 Thank you for coming to the office today.  Call Cologuard to request a new kit 24/7 telephone support at [phone].  ----------------------  Recent Labs    07/16/23 0807 01/26/24 0843  HGBA1C 5.7* 5.5   1. You have symptoms of Vertigo (Benign Paroxysmal Positional Vertigo) - This is commonly caused by inner ear fluid imbalance, sometimes can be worsened by allergies and sinus symptoms, otherwise it can occur randomly sometimes and we may never discover the exact cause. - To treat this, try the Epley Manuever (see diagrams/instructions below) at home up to 3 times a day for 1-2 weeks or until symptoms resolve  See the next page for images describing the Epley Manuever.     ----------------------------------------------------------------------------------------------------------------------      DUE for FASTING BLOOD WORK (no food or drink after midnight before the lab appointment, only water or coffee without cream/sugar on the morning of)  SCHEDULE "Lab Only" visit in the morning at the clinic for lab draw in 6 MONTHS   - Make sure Lab Only appointment is at about 1 week before your next appointment, so that results will be available  For Lab Results, once available within 2-3 days of blood draw, you can can log in to MyChart online to view your results and a brief explanation. Also, we can discuss results at next follow-up visit.   Please schedule a Follow-up Appointment to: Return for 6 month fasting lab > 1 week later Annual Physical.  If you have any other questions or concerns, please feel free to call the office or send a message through MyChart. You may also schedule an earlier appointment if necessary.  Additionally, you may be receiving a survey about your experience at our office within a few days to 1 week by e-mail or mail. We value your feedback.  Domingo Friend, DO [facility], [place]

## 2024-01-26 NOTE — Progress Notes (Signed)
 Subjective:    Patient ID: Steven Horn, male    DOB: [birth year], 53 y.o.   MRN: [id number]  Steven Horn is a 53 y.o. male presenting on 01/26/2024 for Follow-up (6 month Pre DM)   HPI  Discussed the use of AI scribe software for clinical note transcription with the patient, who gave verbal consent to proceed.  History of Present Illness   Steven Horn is a 53 year old male who presents for a routine follow-up to monitor blood sugar levels and discuss dizziness.       Elevated A1c Prior result with Elevated A1c 5.7 Now today A1c 5.5, improved Improved diet and exercise Prior range 5.2 to 5.3   He is now on disability  Needs Handicap placard  He experiences numbness in his toes, attributed to sciatic nerve issues. The numbness is persistent and not related to circulation problems.  He experiences dizziness, particularly when changing positions from leaning or sitting to standing, requiring him to pause after standing to regain balance. He feels lethargic and has difficulty staying awake when sitting still. He sleeps about seven hours a night and does not snore. No vertigo or room spinning, but he describes a sensation similar to being on a roller coaster when closing his eyes after sitting for a while.  CHRONIC HTN: Reports no concerns with BP currently. Checks BP occasionally Current Meds - Lisinopril  10mg  daily Reports good compliance, took meds today. Tolerating well, w/o complaints. Denies CP, dyspnea, HA, edema, dizziness / lightheadedness   HYPERLIPIDEMIA / BMI >30  Chronic Low Back Pain, DDD Lumbar Spine / History of OA/DJD in other joints (hands R > L)        01/26/2024    8:39 AM 07/22/2023    1:14 PM 04/24/2022    1:36 PM  Depression screen PHQ 2/9  Decreased Interest 0 0 0  Down, Depressed, Hopeless 0 0 0  PHQ - 2 Score 0 0 0  Altered sleeping 0 0   Tired, decreased energy 0 0   Change in appetite 0 0   Feeling bad or failure about yourself  0 0   Trouble  concentrating 0 0   Moving slowly or fidgety/restless 0 0   Suicidal thoughts 0 0   PHQ-9 Score 0 0   Difficult doing work/chores Not difficult at all Not difficult at all        01/26/2024    8:39 AM 07/22/2023    1:14 PM 04/13/2021    1:43 PM  GAD 7 : Generalized Anxiety Score  Nervous, Anxious, on Edge 0 0 1  Control/stop worrying 0 0 0  Worry too much - different things 0 0 0  Trouble relaxing 0 0 0  Restless 0 0 0  Easily annoyed or irritable 0 0 1  Afraid - awful might happen 0 0 1  Total GAD 7 Score 0 0 3  Anxiety Difficulty Not difficult at all  Not difficult at all    Social History   Tobacco Use   Smoking status: Every Day    Current packs/day: 1.00    Average packs/day: 1 pack/day for 32.0 years (32.0 ttl pk-yrs)    Types: Cigarettes   Smokeless tobacco: Never  Vaping Use   Vaping status: Never Used  Substance Use Topics   Alcohol use: Yes    Alcohol/week: 2.0 standard drinks of alcohol    Types: 2 Standard drinks or equivalent per week   Drug use: Never    Review  of Systems Per HPI unless specifically indicated above     Objective:    BP 124/74 (BP Location: Left Arm, Cuff Size: Normal)   Pulse (!) 59   Resp 18   Ht 6' 2.5" (1.892 m)   Wt 238 lb (108 kg)   SpO2 99%   BMI 30.15 kg/m   Wt Readings from Last 3 Encounters:  01/26/24 238 lb (108 kg)  07/22/23 238 lb (108 kg)  04/24/22 227 lb 3.2 oz (103.1 kg)    Physical Exam Vitals and nursing note reviewed.  Constitutional:      General: He is not in acute distress.    Appearance: He is well-developed. He is not diaphoretic.     Comments: Well-appearing, comfortable, cooperative  HENT:     Head: Normocephalic and atraumatic.  Eyes:     General:        Right eye: No discharge.        Left eye: No discharge.     Conjunctiva/sclera: Conjunctivae normal.  Neck:     Thyroid: No thyromegaly.  Cardiovascular:     Rate and Rhythm: Normal rate and regular rhythm.     Pulses: Normal pulses.      Heart sounds: Normal heart sounds. No murmur heard. Pulmonary:     Effort: Pulmonary effort is normal. No respiratory distress.     Breath sounds: Normal breath sounds. No wheezing or rales.  Musculoskeletal:        General: Normal range of motion.     Cervical back: Normal range of motion and neck supple.  Lymphadenopathy:     Cervical: No cervical adenopathy.  Skin:    General: Skin is warm and dry.     Findings: No erythema or rash.  Neurological:     Mental Status: He is alert and oriented to person, place, and time. Mental status is at baseline.  Psychiatric:        Behavior: Behavior normal.     Comments: Well groomed, good eye contact, normal speech and thoughts     Results for orders placed or performed in visit on 01/26/24  POCT HgB A1C   Collection Time: 01/26/24  8:43 AM  Result Value Ref Range   Hemoglobin A1C 5.5 4.0 - 5.6 %      Assessment & Plan:   Problem List Items Addressed This Visit   None Visit Diagnoses       Elevated hemoglobin A1c    -  Primary   Relevant Orders   POCT HgB A1C (Completed)     Postural dizziness           Elevated A1c A1c improved to 5.5, indicating good glycemic control. Blood pressure improved to 124/72-74 upon recheck. Emphasized maintaining A1c below 5.7 and lifestyle modifications. - Schedule next wellness visit in October. - Encourage continued monitoring of blood sugar levels. - Advise maintaining active lifestyle to manage blood pressure.  Elevated blood pressure Initial reading elevated at 140 systolic, improved upon recheck. Emphasized regular exercise. - Encourage regular exercise and active lifestyle.  Dizziness Dizziness on position change discussed. Advised caution with sudden changes.  - Advise caution with sudden position changes. - Consider ENT consult if symptoms persist.  Vertigo Described vertigo-like sensations. Discussed potential inner ear involvement. Provided Epley maneuver. - Provide Epley  maneuver exercise for home use. - Consider ENT consult if symptoms persist.  Numbness in toes Persistent numbness likely due to nerve impingement. Known spinal issue.  General Health Maintenance Discussed pneumonia vaccination  and lung scan, declined at this time. Contact Cologuard for replacement kit.  Discuss pneumonia vaccination and lung scan at future visits.        Orders Placed This Encounter  Procedures   POCT HgB A1C    No orders of the defined types were placed in this encounter.   Follow up plan: Return for 6 month fasting lab > 1 week later Annual Physical.  Future labs ordered for 07/26/24   Domingo Friend, DO [facility] 01/26/2024, 8:42 AM

## 2024-07-16 ENCOUNTER — Other Ambulatory Visit: Payer: Self-pay | Admitting: Family Medicine

## 2024-07-16 DIAGNOSIS — G8929 Other chronic pain: Secondary | ICD-10-CM

## 2024-07-16 DIAGNOSIS — M51369 Other intervertebral disc degeneration, lumbar region without mention of lumbar back pain or lower extremity pain: Secondary | ICD-10-CM

## 2024-07-16 DIAGNOSIS — I10 Essential (primary) hypertension: Secondary | ICD-10-CM

## 2024-07-16 DIAGNOSIS — E785 Hyperlipidemia, unspecified: Secondary | ICD-10-CM

## 2024-07-19 NOTE — Telephone Encounter (Signed)
 Requested Prescriptions  Pending Prescriptions Disp Refills   gabapentin  (NEURONTIN ) 300 MG capsule [Pharmacy Med Name: gabapentin  300 mg capsule] 180 capsule 0    Sig: TAKE ONE CAPSULE BY MOUTH TWICE DAILY morning AND bedtime     Neurology: Anticonvulsants - gabapentin  Failed - 07/19/2024 12:04 PM      Failed - Cr in normal range and within 360 days    Creat  Date Value Ref Range Status  07/16/2023 0.74 0.70 - 1.30 mg/dL Final         Passed - Completed PHQ-2 or PHQ-9 in the last 360 days      Passed - Valid encounter within last 12 months    Recent Outpatient Visits           5 months ago Elevated hemoglobin A1c   [facility] [place], Marsa PARAS, DO               lisinopril  (ZESTRIL ) 10 MG tablet [Pharmacy Med Name: lisinopril  10 mg tablet] 90 tablet 0    Sig: TAKE ONE TABLET BY MOUTH ONCE DAILY     Cardiovascular:  ACE Inhibitors Failed - 07/19/2024 12:04 PM      Failed - Cr in normal range and within 180 days    Creat  Date Value Ref Range Status  07/16/2023 0.74 0.70 - 1.30 mg/dL Final         Failed - K in normal range and within 180 days    Potassium  Date Value Ref Range Status  07/16/2023 4.9 3.5 - 5.3 mmol/L Final         Passed - Patient is not pregnant      Passed - Last BP in normal range    BP Readings from Last 1 Encounters:  01/26/24 124/74         Passed - Valid encounter within last 6 months    Recent Outpatient Visits           5 months ago Elevated hemoglobin A1c   [facility] [place], Marsa PARAS, DO               rosuvastatin  (CRESTOR ) 10 MG tablet [Pharmacy Med Name: rosuvastatin  10 mg tablet] 90 tablet 0    Sig: TAKE ONE TABLET BY MOUTH AT BEDTIME     Cardiovascular:  Antilipid - Statins 2 Failed - 07/19/2024 12:04 PM      Failed - Cr in normal range and within 360 days    Creat  Date Value Ref Range Status  07/16/2023 0.74 0.70 - 1.30 mg/dL Final          Failed - Lipid Panel in normal range within the last 12 months    Cholesterol  Date Value Ref Range Status  07/16/2023 180 <200 mg/dL Final   LDL Cholesterol (Calc)  Date Value Ref Range Status  07/16/2023 107 (H) mg/dL (calc) Final    Comment:    Reference range: <100 . Desirable range <100 mg/dL for primary prevention;   <70 mg/dL for patients with CHD or diabetic patients  with > or = 2 CHD risk factors. SABRA LDL-C is now calculated using the Martin-Hopkins  calculation, which is a validated novel method providing  better accuracy than the Friedewald equation in the  estimation of LDL-C.  Gladis APPLETHWAITE et al. SANDREA. 7986;689(80): 2061-2068  (http://education.[facility].com/faq/FAQ164)    HDL  Date Value Ref Range Status  07/16/2023 52 > OR = 40 mg/dL Final  Triglycerides  Date Value Ref Range Status  07/16/2023 114 <150 mg/dL Final         Passed - Patient is not pregnant      Passed - Valid encounter within last 12 months    Recent Outpatient Visits           5 months ago Elevated hemoglobin A1c   [facility] [place], Marsa PARAS, [place]

## 2024-07-26 ENCOUNTER — Other Ambulatory Visit

## 2024-07-26 DIAGNOSIS — R7309 Other abnormal glucose: Secondary | ICD-10-CM

## 2024-07-26 DIAGNOSIS — Z125 Encounter for screening for malignant neoplasm of prostate: Secondary | ICD-10-CM

## 2024-07-26 DIAGNOSIS — R351 Nocturia: Secondary | ICD-10-CM

## 2024-07-26 DIAGNOSIS — Z0000 Encounter for general adult medical examination without abnormal findings: Secondary | ICD-10-CM

## 2024-07-26 DIAGNOSIS — E7800 Pure hypercholesterolemia, unspecified: Secondary | ICD-10-CM

## 2024-07-26 DIAGNOSIS — I10 Essential (primary) hypertension: Secondary | ICD-10-CM

## 2024-07-27 ENCOUNTER — Ambulatory Visit: Payer: Self-pay | Admitting: Family Medicine

## 2024-07-27 DIAGNOSIS — R972 Elevated prostate specific antigen [PSA]: Secondary | ICD-10-CM | POA: Insufficient documentation

## 2024-07-27 LAB — LIPID PANEL
Cholesterol: 174 mg/dL (ref <200)
HDL: 42 mg/dL (ref >=40)
LDL Cholesterol (Calc): 109 mg/dL — ABNORMAL HIGH
Non-HDL Cholesterol (Calc): 132 mg/dL — ABNORMAL HIGH (ref <130)
Total CHOL/HDL Ratio: 4.1 (calc) (ref <5.0)
Triglycerides: 121 mg/dL (ref <150)

## 2024-07-27 LAB — CBC WITH DIFFERENTIAL/PLATELET
Absolute Lymphocytes: 2086 {cells}/uL (ref 850–3900)
Absolute Monocytes: 433 {cells}/uL (ref 200–950)
Basophils Absolute: 43 {cells}/uL (ref 0–200)
Basophils Relative: 0.7 %
Eosinophils Absolute: 482 {cells}/uL (ref 15–500)
Eosinophils Relative: 7.9 %
HCT: 46.9 % (ref 38.5–50.0)
Hemoglobin: 15.7 g/dL (ref 13.2–17.1)
MCH: 30.1 pg (ref 27.0–33.0)
MCHC: 33.5 g/dL (ref 32.0–36.0)
MCV: 89.8 fL (ref 80.0–100.0)
MPV: 10.6 fL (ref 7.5–12.5)
Monocytes Relative: 7.1 %
Neutro Abs: 3056 {cells}/uL (ref 1500–7800)
Neutrophils Relative %: 50.1 %
Platelets: 232 Thousand/uL (ref 140–400)
RBC: 5.22 Million/uL (ref 4.20–5.80)
RDW: 12.7 % (ref 11.0–15.0)
Total Lymphocyte: 34.2 %
WBC: 6.1 Thousand/uL (ref 3.8–10.8)

## 2024-07-27 LAB — COMPREHENSIVE METABOLIC PANEL WITH GFR
AG Ratio: 1.5 (calc) (ref 1.0–2.5)
ALT: 17 U/L (ref 9–46)
AST: 15 U/L (ref 10–35)
Albumin: 4.5 g/dL (ref 3.6–5.1)
Alkaline phosphatase (APISO): 74 U/L (ref 35–144)
BUN: 11 mg/dL (ref 7–25)
CO2: 31 mmol/L (ref 20–32)
Calcium: 9.4 mg/dL (ref 8.6–10.3)
Chloride: 102 mmol/L (ref 98–110)
Creat: 0.8 mg/dL (ref 0.70–1.30)
Globulin: 3 g/dL (ref 1.9–3.7)
Glucose, Bld: 99 mg/dL (ref 65–99)
Potassium: 4.8 mmol/L (ref 3.5–5.3)
Sodium: 140 mmol/L (ref 135–146)
Total Bilirubin: 0.7 mg/dL (ref 0.2–1.2)
Total Protein: 7.5 g/dL (ref 6.1–8.1)
eGFR: 105 mL/min/1.73m2 (ref >=60)

## 2024-07-27 LAB — PSA: PSA: 4.15 ng/mL — ABNORMAL HIGH (ref <=4.00)

## 2024-07-27 LAB — HEMOGLOBIN A1C
Hgb A1c MFr Bld: 5.5 % (ref <5.7)
Mean Plasma Glucose: 111 mg/dL
eAG (mmol/L): 6.2 mmol/L

## 2024-08-02 ENCOUNTER — Ambulatory Visit (INDEPENDENT_AMBULATORY_CARE_PROVIDER_SITE_OTHER): Admitting: Family Medicine

## 2024-08-02 ENCOUNTER — Encounter: Payer: Self-pay | Admitting: Family Medicine

## 2024-08-02 VITALS — BP 124/70 | HR 75 | Ht 74.5 in | Wt 235.0 lb

## 2024-08-02 DIAGNOSIS — R7309 Other abnormal glucose: Secondary | ICD-10-CM

## 2024-08-02 DIAGNOSIS — I10 Essential (primary) hypertension: Secondary | ICD-10-CM

## 2024-08-02 DIAGNOSIS — G8929 Other chronic pain: Secondary | ICD-10-CM

## 2024-08-02 DIAGNOSIS — E7800 Pure hypercholesterolemia, unspecified: Secondary | ICD-10-CM

## 2024-08-02 DIAGNOSIS — Z0000 Encounter for general adult medical examination without abnormal findings: Secondary | ICD-10-CM | POA: Diagnosis not present

## 2024-08-02 DIAGNOSIS — M5136 Other intervertebral disc degeneration, lumbar region with discogenic back pain only: Secondary | ICD-10-CM

## 2024-08-02 DIAGNOSIS — M5441 Lumbago with sciatica, right side: Secondary | ICD-10-CM

## 2024-08-02 DIAGNOSIS — E785 Hyperlipidemia, unspecified: Secondary | ICD-10-CM

## 2024-08-02 DIAGNOSIS — Z1211 Encounter for screening for malignant neoplasm of colon: Secondary | ICD-10-CM

## 2024-08-02 DIAGNOSIS — R351 Nocturia: Secondary | ICD-10-CM

## 2024-08-02 DIAGNOSIS — F411 Generalized anxiety disorder: Secondary | ICD-10-CM

## 2024-08-02 DIAGNOSIS — R972 Elevated prostate specific antigen [PSA]: Secondary | ICD-10-CM

## 2024-08-02 MED ORDER — BUSPIRONE HCL 5 MG PO TABS
5.0000 mg | ORAL_TABLET | Freq: Three times a day (TID) | ORAL | 2 refills | Status: AC | PRN
Start: 2024-08-02 — End: ?

## 2024-08-02 MED ORDER — GABAPENTIN 300 MG PO CAPS: ORAL_CAPSULE | ORAL | 0 refills | Status: AC

## 2024-08-02 MED ORDER — ROSUVASTATIN CALCIUM 10 MG PO TABS: 10.0000 mg | ORAL_TABLET | Freq: Every day | ORAL | 3 refills | Status: AC

## 2024-08-02 MED ORDER — LISINOPRIL 10 MG PO TABS: 10.0000 mg | ORAL_TABLET | Freq: Every day | ORAL | 3 refills | Status: AC

## 2024-08-02 NOTE — Patient Instructions (Addendum)
 Thank you for coming to the office today.  Start Buspar for anxiety Take 5 mg up to 3 times a day as needed, prefer to take once daily during key anxiety time, may take up to 2 more doses in 24 hours if needed. Can skip if prefer, this med does not require daily use.  ----------  Contact me back when ready to pursue the CT Coronary Scan / Looks at heart and lungs.  PSA repeat test today, stay tuned for results if >4 I would recommend referral. < 4 such as 3 or less then we can repeat again in several months.  Colon Cancer Screening: Ordered the Cologuard (home kit) test for colon cancer screening. Stay tuned for further updates.  It will be shipped to you directly. If not received in 2-4 weeks, call us  or the company.   If you send it back and no results are received in 2-4 weeks, call us  or the company as well!   Colon Cancer Screening: - For all adults age 13+ routine colon cancer screening is highly recommended.     - Recent guidelines from American Cancer Society recommend starting age of 51 - Early detection of colon cancer is important, because often there are no warning signs or symptoms, also if found early usually it can be cured. Late stage is hard to treat.   - If Cologuard is NEGATIVE, then it is good for 3 years before next due - If Cologuard is POSITIVE, then it is strongly advised to get a Colonoscopy, which allows the GI doctor to locate the source of the cancer or polyp (even very early stage) and treat it by removing it. ------------------------- Follow instructions to collect sample, you may call the company for any help or questions, 24/7 telephone support at [phone].      Please schedule a Follow-up Appointment to: Return if symptoms worsen or fail to improve.  If you have any other questions or concerns, please feel free to call the office or send a message through MyChart. You may also schedule an earlier appointment if necessary.  Additionally, you  may be receiving a survey about your experience at our office within a few days to 1 week by e-mail or mail. We value your feedback.  Marsa Officer, DO [facility], [place]

## 2024-08-02 NOTE — Progress Notes (Signed)
 Subjective:    Patient ID: Steven Horn, male    DOB: December 18, 1970, 53 y.o.   MRN: [id number]  Steven Horn is a 53 y.o. male presenting on 08/02/2024 for Annual Exam   HPI  Discussed the use of AI scribe software for clinical note transcription with the patient, who gave verbal consent to proceed.  History of Present Illness   Steven Horn is a 53 year old male who presents for an annual physical exam.  Prostate-specific antigen elevation - Recent increase in PSA from 0.5 to 4.15 within past 1 year on routine screening. - Discussed possibility with false elevation on phone, potential attribute elevation to intercourse the day prior to testing - Currently abstaining from intercourse to monitor PSA levels - No urinary symptoms including decreased urine flow, frequency, burning, or nocturia more than once per night - No significant pain unresponsive to ibuprofen  Tobacco use and respiratory symptoms - Smokes more than one pack of cigarettes per day - Experiences shortness of breath with minimal exertion, such as walking to the mailbox - Has not pursued lung cancer screening  Anxiety symptoms - Increased anxiety, particularly related to wife's recent diagnosis of a large abdominal tumor - Interested in medication to help manage anxiety - Has never taken medication for anxiety previously - Prefers options that do not require daily use  GERD OTC nexium  Elevated A1c A1c 5.5 improved Improved diet and exercise    CHRONIC HTN: Reports no concerns with BP currently. Checks BP occasionally Current Meds - Lisinopril  10mg  daily Reports good compliance, took meds today. Tolerating well, w/o complaints. Denies CP, dyspnea, HA, edema, dizziness / lightheadedness   HYPERLIPIDEMIA / BMI >29 Elevated LDL 109, mild, stable on Statin he does have early family history of MI / CAD on maternal side - Currently taking Rosuvastatin  10mg  daily, tolerating well without side effects or myalgias -  Maternal family history uncle passed age 3 MI   Chronic Low Back Pain, DDD Lumbar Spine / History of OA/DJD in other joints (hands R > L) Past history w/ orthopedic, pain management and ESI therapy Now on medication management - Taking Gabapentin  300mg  twice a day now - Taking NSAID OTC AS NEEDED and Tylenol 325mg  BID, few weeks at a time for flares   Tobacco Abuse Smoking cessation, can reconsider chantix  Not ready to quit yet, due to stressors lately     Health Maintenance:    Colon CA Screening - last cologuard 12/31/19 negative. Repeat due 12/2022. Overdue now, ordered Cologuard in 2025       08/02/2024    8:44 AM 01/26/2024    8:39 AM 07/22/2023    1:14 PM  Depression screen PHQ 2/9  Decreased Interest 1 0 0  Down, Depressed, Hopeless 0 0 0  PHQ - 2 Score 1 0 0  Altered sleeping 0 0 0  Tired, decreased energy 1 0 0  Change in appetite 0 0 0  Feeling bad or failure about yourself  0 0 0  Trouble concentrating 1 0 0  Moving slowly or fidgety/restless 0 0 0  Suicidal thoughts 0 0 0  PHQ-9 Score 3 0 0  Difficult doing work/chores Somewhat difficult Not difficult at all Not difficult at all       08/02/2024    8:44 AM 01/26/2024    8:39 AM 07/22/2023    1:14 PM 04/13/2021    1:43 PM  GAD 7 : Generalized Anxiety Score  Nervous, Anxious, on Edge 1 0 0  1  Control/stop worrying 1 0 0 0  Worry too much - different things 1 0 0 0  Trouble relaxing 0 0 0 0  Restless 0 0 0 0  Easily annoyed or irritable 0 0 0 1  Afraid - awful might happen 1 0 0 1  Total GAD 7 Score 4 0 0 3  Anxiety Difficulty Somewhat difficult Not difficult at all  Not difficult at all     Past Medical History:  Diagnosis Date   Hyperlipidemia    Hypertension    Past Surgical History:  Procedure Laterality Date   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   L ring finger, distal infection due to strep, req debridement then amputation, [facility]   Social History   Socioeconomic History   Marital status:  Married    Spouse name: Not on file   Number of children: Not on file   Years of education: High School   Highest education level: High school graduate  Occupational History   Occupation: [employer]  Tobacco Use   Smoking status: Every Day    Current packs/day: 1.00    Average packs/day: 1 pack/day for 32.0 years (32.0 ttl pk-yrs)    Types: Cigarettes   Smokeless tobacco: Never  Vaping Use   Vaping status: Never Used  Substance and Sexual Activity   Alcohol use: Yes    Alcohol/week: 2.0 standard drinks of alcohol    Types: 2 Standard drinks or equivalent per week   Drug use: Never   Sexual activity: Not on file  Other Topics Concern   Not on file  Social History Narrative   Not on file   Social Drivers of Health   Financial Resource Strain: Not on file  Food Insecurity: Not on file  Transportation Needs: Not on file  Physical Activity: Not on file  Stress: Not on file  Social Connections: Not on file  Intimate Partner Violence: Not on file   Family History  Problem Relation Age of Onset   Diabetes Mother    Hyperlipidemia Mother    Depression Father    Alcohol abuse Father    Hyperlipidemia Father    Hypertension Father    Hyperlipidemia Brother    Lung cancer Maternal Grandmother 15   Heart attack Maternal Grandfather 44   Heart attack Maternal Uncle 54   Colon cancer Neg Hx    No current outpatient medications on file prior to visit.   No current facility-administered medications on file prior to visit.    Review of Systems  Constitutional:  Negative for activity change, appetite change, chills, diaphoresis, fatigue and fever.  HENT:  Negative for congestion and hearing loss.   Eyes:  Negative for visual disturbance.  Respiratory:  Negative for cough, chest tightness, shortness of breath and wheezing.   Cardiovascular:  Negative for chest pain, palpitations and leg swelling.  Gastrointestinal:  Negative for abdominal pain, constipation, diarrhea, nausea  and vomiting.  Genitourinary:  Negative for dysuria, frequency and hematuria.  Musculoskeletal:  Negative for arthralgias and neck pain.  Skin:  Negative for rash.  Neurological:  Negative for dizziness, weakness, light-headedness, numbness and headaches.  Hematological:  Negative for adenopathy.  Psychiatric/Behavioral:  Negative for behavioral problems, dysphoric mood and sleep disturbance.    Per HPI unless specifically indicated above     Objective:    BP 124/70 (BP Location: Left Arm, Cuff Size: Normal)   Pulse 75   Ht 6' 2.5 (1.892 m)   Wt 235 lb (106.6 kg)  SpO2 97%   BMI 29.77 kg/m   Wt Readings from Last 3 Encounters:  08/02/24 235 lb (106.6 kg)  01/26/24 238 lb (108 kg)  07/22/23 238 lb (108 kg)    Physical Exam Vitals and nursing note reviewed.  Constitutional:      General: He is not in acute distress.    Appearance: He is well-developed. He is not diaphoretic.     Comments: Well-appearing, comfortable, cooperative  HENT:     Head: Normocephalic and atraumatic.  Eyes:     General:        Right eye: No discharge.        Left eye: No discharge.     Conjunctiva/sclera: Conjunctivae normal.     Pupils: Pupils are equal, round, and reactive to light.  Neck:     Thyroid: No thyromegaly.     Vascular: No carotid bruit.  Cardiovascular:     Rate and Rhythm: Normal rate and regular rhythm.     Pulses: Normal pulses.     Heart sounds: Normal heart sounds. No murmur heard. Pulmonary:     Effort: Pulmonary effort is normal. No respiratory distress.     Breath sounds: Normal breath sounds. No wheezing or rales.  Abdominal:     General: Bowel sounds are normal. There is no distension.     Palpations: Abdomen is soft. There is no mass.     Tenderness: There is no abdominal tenderness.  Musculoskeletal:        General: No tenderness. Normal range of motion.     Cervical back: Normal range of motion and neck supple.     Right lower leg: No edema.     Left lower  leg: No edema.     Comments: Upper / Lower Extremities: - Normal muscle tone, strength bilateral upper extremities 5/5, lower extremities 5/5  Lymphadenopathy:     Cervical: No cervical adenopathy.  Skin:    General: Skin is warm and dry.     Findings: No erythema or rash.  Neurological:     Mental Status: He is alert and oriented to person, place, and time.     Comments: Distal sensation intact to light touch all extremities  Psychiatric:        Mood and Affect: Mood normal.        Behavior: Behavior normal.        Thought Content: Thought content normal.     Comments: Well groomed, good eye contact, normal speech and thoughts     Results for orders placed or performed in visit on 07/26/24  Comprehensive metabolic panel with GFR   Collection Time: 07/26/24  8:03 AM  Result Value Ref Range   Glucose, Bld 99 65 - 99 mg/dL   BUN 11 7 - 25 mg/dL   Creat 9.19 9.29 - 8.69 mg/dL   eGFR 894 > OR = 60 fO/fpw/8.26f7   BUN/Creatinine Ratio SEE NOTE: 6 - 22 (calc)   Sodium 140 135 - 146 mmol/L   Potassium 4.8 3.5 - 5.3 mmol/L   Chloride 102 98 - 110 mmol/L   CO2 31 20 - 32 mmol/L   Calcium  9.4 8.6 - 10.3 mg/dL   Total Protein 7.5 6.1 - 8.1 g/dL   Albumin 4.5 3.6 - 5.1 g/dL   Globulin 3.0 1.9 - 3.7 g/dL (calc)   AG Ratio 1.5 1.0 - 2.5 (calc)   Total Bilirubin 0.7 0.2 - 1.2 mg/dL   Alkaline phosphatase (APISO) 74 35 - 144 U/L  AST 15 10 - 35 U/L   ALT 17 9 - 46 U/L  PSA   Collection Time: 07/26/24  8:03 AM  Result Value Ref Range   PSA 4.15 (H) < OR = 4.00 ng/mL  CBC with Differential/Platelet   Collection Time: 07/26/24  8:03 AM  Result Value Ref Range   WBC 6.1 3.8 - 10.8 Thousand/uL   RBC 5.22 4.20 - 5.80 Million/uL   Hemoglobin 15.7 13.2 - 17.1 g/dL   HCT 53.0 61.4 - 49.9 %   MCV 89.8 80.0 - 100.0 fL   MCH 30.1 27.0 - 33.0 pg   MCHC 33.5 32.0 - 36.0 g/dL   RDW 87.2 88.9 - 84.9 %   Platelets 232 140 - 400 Thousand/uL   MPV 10.6 7.5 - 12.5 fL   Neutro Abs 3,056 1,500 -  7,800 cells/uL   Absolute Lymphocytes 2,086 850 - 3,900 cells/uL   Absolute Monocytes 433 200 - 950 cells/uL   Eosinophils Absolute 482 15 - 500 cells/uL   Basophils Absolute 43 0 - 200 cells/uL   Neutrophils Relative % 50.1 %   Total Lymphocyte 34.2 %   Monocytes Relative 7.1 %   Eosinophils Relative 7.9 %   Basophils Relative 0.7 %  Hemoglobin A1c   Collection Time: 07/26/24  8:03 AM  Result Value Ref Range   Hgb A1c MFr Bld 5.5 <5.7 %   Mean Plasma Glucose 111 mg/dL   eAG (mmol/L) 6.2 mmol/L  Lipid panel   Collection Time: 07/26/24  8:03 AM  Result Value Ref Range   Cholesterol 174 <200 mg/dL   HDL 42 > OR = 40 mg/dL   Triglycerides 878 <849 mg/dL   LDL Cholesterol (Calc) 109 (H) mg/dL (calc)   Total CHOL/HDL Ratio 4.1 <5.0 (calc)   Non-HDL Cholesterol (Calc) 132 (H) <130 mg/dL (calc)      Assessment & Plan:   Problem List Items Addressed This Visit     Chronic bilateral low back pain with right-sided sciatica   Relevant Medications   gabapentin  (NEURONTIN ) 300 MG capsule   busPIRone (BUSPAR) 5 MG tablet   DDD (degenerative disc disease), lumbar   Relevant Medications   gabapentin  (NEURONTIN ) 300 MG capsule   Elevated PSA, less than 10 ng/ml   Relevant Orders   PSA, total and free   Urine Culture   Urinalysis, Routine w reflex microscopic   Essential hypertension   Relevant Medications   lisinopril  (ZESTRIL ) 10 MG tablet   rosuvastatin  (CRESTOR ) 10 MG tablet   Hyperlipidemia   Relevant Medications   lisinopril  (ZESTRIL ) 10 MG tablet   rosuvastatin  (CRESTOR ) 10 MG tablet   Other Visit Diagnoses       Annual physical exam    -  Primary     Elevated hemoglobin A1c         Nocturia         Screening for colon cancer       Relevant Orders   Cologuard     GAD (generalized anxiety disorder)       Relevant Medications   busPIRone (BUSPAR) 5 MG tablet        Updated Health Maintenance information Reviewed recent lab results with patient Encouraged  improvement to lifestyle with diet and exercise Goal of weight loss  Adult Wellness Visit Cologuard screening due. Vaccinations and lung cancer screening discussed. - Ordered Cologuard test since due from 2024, last 2021 negative - Discussed lung cancer screening options as smoker ,can do LDCT through [facility]  or Coronary Calcium  CT. He prefers to delay until wife's work up completed for her health conditions  - Discussed pneumonia and hepatitis B vaccinations. - Discussed tetanus and shingles vaccinations.  Elevated prostate specific antigen (PSA) PSA increased from 0.5 to 4.15, likely false positive due to recent intercourse. No prostate cancer symptoms. Discussed false positive potential and need for repeat testing. Possible urologist referral if PSA remains elevated. - Repeated PSA test today, total and free % - Add Urinalysis + Urine Culture to evaluate other possible etiologies - If PSA remains elevated, consider referral to urologist for MRI or biopsy.  Essential hypertension Initial elevated BP likely due to anxiety, normalized on recheck. - Continue lisinopril  10mg  daily  Hyperlipidemia Cholesterol at 109, stable. Rosuvastatin  effective. - Continue rosuvastatin  10mg   Generalized anxiety disorder Increased anxiety due to stressors with his and wife's health concerns lately, among other factors Prefers as-needed medication. Discussed Buspar use. - Prescribed Buspar 5 mg, up to three times a day as needed.  Chronic Back Pain - Continue gabapentin  as prescribed         Orders Placed This Encounter  Procedures   Urine Culture   PSA, total and free   Cologuard   Urinalysis, Routine w reflex microscopic    Meds ordered this encounter  Medications   gabapentin  (NEURONTIN ) 300 MG capsule    Sig: TAKE ONE CAPSULE BY MOUTH TWICE DAILY morning AND bedtime    Dispense:  180 capsule    Refill:  0    Add future refills to pharmacy   lisinopril  (ZESTRIL ) 10 MG tablet     Sig: Take 1 tablet (10 mg total) by mouth daily.    Dispense:  90 tablet    Refill:  3    Add future refills to pharmacy   rosuvastatin  (CRESTOR ) 10 MG tablet    Sig: Take 1 tablet (10 mg total) by mouth at bedtime.    Dispense:  90 tablet    Refill:  3    Add future refills to pharmacy   busPIRone (BUSPAR) 5 MG tablet    Sig: Take 1 tablet (5 mg total) by mouth 3 (three) times daily as needed (anxiety).    Dispense:  90 tablet    Refill:  2     Follow up plan: Return if symptoms worsen or fail to improve.  Marsa Officer, DO [facility] 08/02/2024, 8:10 AM

## 2024-08-03 ENCOUNTER — Ambulatory Visit: Payer: Self-pay | Admitting: Family Medicine

## 2024-08-04 LAB — URINALYSIS, ROUTINE W REFLEX MICROSCOPIC
Bilirubin Urine: NEGATIVE
Glucose, UA: NEGATIVE
Hgb urine dipstick: NEGATIVE
Ketones, ur: NEGATIVE
Leukocytes,Ua: NEGATIVE
Nitrite: NEGATIVE
Protein, ur: NEGATIVE
Specific Gravity, Urine: 1.008 (ref 1.001–1.035)
pH: 7 (ref 5.0–8.0)

## 2024-08-04 LAB — PSA, TOTAL AND FREE
PSA, % Free: 20 % — ABNORMAL LOW (ref >25)
PSA, Free: 0.7 ng/mL
PSA, Total: 3.5 ng/mL (ref <=4.0)

## 2024-08-04 LAB — URINE CULTURE
MICRO NUMBER:: 17183665
Result:: NO GROWTH
SPECIMEN QUALITY:: ADEQUATE

## 2024-08-05 ENCOUNTER — Other Ambulatory Visit: Payer: Self-pay | Admitting: Family Medicine

## 2024-08-05 DIAGNOSIS — R972 Elevated prostate specific antigen [PSA]: Secondary | ICD-10-CM

## 2024-09-04 LAB — COLOGUARD: COLOGUARD: NEGATIVE

## 2024-09-06 ENCOUNTER — Other Ambulatory Visit

## 2024-09-06 DIAGNOSIS — R972 Elevated prostate specific antigen [PSA]: Secondary | ICD-10-CM

## 2024-09-07 LAB — URINALYSIS, ROUTINE W REFLEX MICROSCOPIC
Bilirubin Urine: NEGATIVE
Glucose, UA: NEGATIVE
Hgb urine dipstick: NEGATIVE
Ketones, ur: NEGATIVE
Leukocytes,Ua: NEGATIVE
Nitrite: NEGATIVE
Protein, ur: NEGATIVE
Specific Gravity, Urine: 1.017 (ref 1.001–1.035)
pH: 7 (ref 5.0–8.0)

## 2024-09-07 LAB — URINE CULTURE
MICRO NUMBER:: 17326544
Result:: NO GROWTH
SPECIMEN QUALITY:: ADEQUATE

## 2024-09-07 LAB — PSA: PSA: 3.68 ng/mL (ref <=4.00)

## 2024-09-09 ENCOUNTER — Ambulatory Visit: Payer: Self-pay | Admitting: Family Medicine

## 2024-09-10 ENCOUNTER — Ambulatory Visit (INDEPENDENT_AMBULATORY_CARE_PROVIDER_SITE_OTHER): Admitting: Family Medicine

## 2024-09-10 ENCOUNTER — Other Ambulatory Visit: Payer: Self-pay | Admitting: Family Medicine

## 2024-09-10 VITALS — BP 130/80 | HR 66 | Ht 74.5 in | Wt 239.1 lb

## 2024-09-10 DIAGNOSIS — R972 Elevated prostate specific antigen [PSA]: Secondary | ICD-10-CM

## 2024-09-10 DIAGNOSIS — I10 Essential (primary) hypertension: Secondary | ICD-10-CM

## 2024-09-10 DIAGNOSIS — R7309 Other abnormal glucose: Secondary | ICD-10-CM

## 2024-09-10 DIAGNOSIS — E7800 Pure hypercholesterolemia, unspecified: Secondary | ICD-10-CM

## 2024-09-10 DIAGNOSIS — Z0000 Encounter for general adult medical examination without abnormal findings: Secondary | ICD-10-CM

## 2024-09-10 DIAGNOSIS — R351 Nocturia: Secondary | ICD-10-CM

## 2024-09-10 NOTE — Progress Notes (Signed)
 Subjective:    Patient ID: Steven Horn, male    DOB: January 19, 1971, 53 y.o.   MRN: [id number]  Steven Horn is a 53 y.o. male presenting on 09/10/2024 for Elevated PSA   HPI  Discussed the use of AI scribe software for clinical note transcription with the patient, who gave verbal consent to proceed.  History of Present Illness   Steven Horn is a 53 year old male who presents with elevated PSA levels for further evaluation.  Elevated PSA  - Past PSA stable 0.5 range. However chart review shows one PSA reading from 2017 at 3.0 - Over the past four years, PSA levels remained low normal with no significant changes until the past year. - In the past year, PSA increased from 0.5 to 4.15. - Repeat PSA decreased to 3.5, then slightly increased to 3.68, indicating some stability but not a return to baseline. - No history of significant urinary symptoms or urinary tract infections. - Most recent PSA had abstained from intercourse prior to lab  Psychological stress and medication use - Not taking any medications for anxiety, despite being offered a prescription. - Prefers a 'less is more' approach to medication. - Currently experiencing psychological stress related to his wife's medical issues, which are a priority.         09/10/2024    3:47 PM 08/02/2024    8:44 AM 01/26/2024    8:39 AM  Depression screen PHQ 2/9  Decreased Interest 0 1 0  Down, Depressed, Hopeless 0 0 0  PHQ - 2 Score 0 1 0  Altered sleeping 0 0 0  Tired, decreased energy 0 1 0  Change in appetite 0 0 0  Feeling bad or failure about yourself  0 0 0  Trouble concentrating 0 1 0  Moving slowly or fidgety/restless 0 0 0  Suicidal thoughts 0 0 0  PHQ-9 Score 0 3  0   Difficult doing work/chores  Somewhat difficult Not difficult at all     Data saved with a previous flowsheet row definition       09/10/2024    3:47 PM 08/02/2024    8:44 AM 01/26/2024    8:39 AM 07/22/2023    1:14 PM  GAD 7 : Generalized Anxiety  Score  Nervous, Anxious, on Edge 0 1 0 0  Control/stop worrying 0 1 0 0  Worry too much - different things 0 1 0 0  Trouble relaxing 0 0 0 0  Restless 0 0 0 0  Easily annoyed or irritable 0 0 0 0  Afraid - awful might happen 0 1 0 0  Total GAD 7 Score 0 4 0 0  Anxiety Difficulty  Somewhat difficult Not difficult at all     Social History[1]  Review of Systems Per HPI unless specifically indicated above     Objective:    BP 130/80 (BP Location: Right Arm, Patient Position: Sitting, Cuff Size: Large)   Pulse 66   Ht 6' 2.5 (1.892 m)   Wt 239 lb 2 oz (108.5 kg)   SpO2 95%   BMI 30.29 kg/m   Wt Readings from Last 3 Encounters:  09/10/24 239 lb 2 oz (108.5 kg)  08/02/24 235 lb (106.6 kg)  01/26/24 238 lb (108 kg)    Physical Exam Vitals and nursing note reviewed.  Constitutional:      General: He is not in acute distress.    Appearance: Normal appearance. He is well-developed. He is not diaphoretic.  Comments: Well-appearing, comfortable, cooperative  HENT:     Head: Normocephalic and atraumatic.  Eyes:     General:        Right eye: No discharge.        Left eye: No discharge.     Conjunctiva/sclera: Conjunctivae normal.  Cardiovascular:     Rate and Rhythm: Normal rate.  Pulmonary:     Effort: Pulmonary effort is normal.  Skin:    General: Skin is warm and dry.     Findings: No erythema or rash.  Neurological:     Mental Status: He is alert and oriented to person, place, and time.  Psychiatric:        Mood and Affect: Mood normal.        Behavior: Behavior normal.        Thought Content: Thought content normal.     Comments: Well groomed, good eye contact, normal speech and thoughts     Results for orders placed or performed in visit on 09/06/24  PSA   Collection Time: 09/06/24  7:54 AM  Result Value Ref Range   PSA 3.68 < OR = 4.00 ng/mL      Assessment & Plan:   Problem List Items Addressed This Visit     Elevated PSA, less than 10 ng/ml -  Primary   Relevant Orders   Ambulatory referral to Urology     Elevated prostate specific antigen (PSA) PSA levels increased from 0.5 to 4.15, now stable at 3.5 and 3.68. Differential includes prostate inflammation or enlargement; cancer not ruled out. Urinalysis negative for infection. \  - Referred to urology for further evaluation and second opinion. Given persistent PSA now remaining in mid 3 range - Likely repeat PSA / DRE and other eval by Urology but we agree for further eval - Consider MRI or other imaging if recommended by urology.   Orders Placed This Encounter  Procedures   Ambulatory referral to Urology    Referral Priority:   Routine    Referral Type:   Consultation    Referral Reason:   Specialty Services Required    Requested Specialty:   Urology    Number of Visits Requested:   1    No orders of the defined types were placed in this encounter.   Follow up plan: Return in about 11 months (around 08/11/2025) for 11 months fasting lab > 1 week later Annual Physical.  Future labs ordered for 07/28/25  Marsa Officer, DO [facility] 09/10/2024, 3:57 PM     [1]  Social History Tobacco Use   Smoking status: Every Day    Current packs/day: 1.00    Average packs/day: 1 pack/day for 32.0 years (32.0 ttl pk-yrs)    Types: Cigarettes   Smokeless tobacco: Never  Vaping Use   Vaping status: Never Used  Substance Use Topics   Alcohol use: Yes    Alcohol/week: 2.0 standard drinks of alcohol    Types: 2 Standard drinks or equivalent per week   Drug use: Never

## 2024-09-10 NOTE — Patient Instructions (Addendum)
 Thank you for coming to the office today.  PSA stable now 3.5 to 3.68, but not back to 100% normal.  We will refer for 2nd opinion from Urologist. They may repeat lab, consider MRI imaging or other.   UROLOGY  [facility] -1st floor [address] [place],  [place]  [address] Phone: [phone]  Please schedule a Follow-up Appointment to: Return in about 11 months (around 08/11/2025) for 11 months fasting lab > 1 week later Annual Physical.  If you have any other questions or concerns, please feel free to call the office or send a message through MyChart. You may also schedule an earlier appointment if necessary.  Additionally, you may be receiving a survey about your experience at our office within a few days to 1 week by e-mail or mail. We value your feedback.  Marsa Officer, DO [facility], [place]

## 2024-09-13 ENCOUNTER — Ambulatory Visit: Admitting: Family Medicine

## 2024-11-04 ENCOUNTER — Ambulatory Visit: Admitting: Urology

## 2024-11-23 ENCOUNTER — Ambulatory Visit: Admitting: Urology

## 2025-07-28 ENCOUNTER — Other Ambulatory Visit

## 2025-08-04 ENCOUNTER — Encounter: Admitting: Family Medicine
